# Patient Record
Sex: Female | Born: 1980 | Race: White | Hispanic: No | Marital: Married | Smoking: Never smoker
Health system: Southern US, Community
[De-identification: ages and names within clinical notes are randomized; demographics above are authoritative.]

## PROBLEM LIST (undated history)

## (undated) DIAGNOSIS — F419 Anxiety disorder, unspecified: Secondary | ICD-10-CM

## (undated) DIAGNOSIS — S060XAA Concussion with loss of consciousness status unknown, initial encounter: Secondary | ICD-10-CM

## (undated) DIAGNOSIS — F329 Major depressive disorder, single episode, unspecified: Secondary | ICD-10-CM

## (undated) DIAGNOSIS — S060X9A Concussion with loss of consciousness of unspecified duration, initial encounter: Secondary | ICD-10-CM

## (undated) DIAGNOSIS — G5600 Carpal tunnel syndrome, unspecified upper limb: Secondary | ICD-10-CM

## (undated) DIAGNOSIS — F32A Depression, unspecified: Secondary | ICD-10-CM

---

## 2015-02-11 ENCOUNTER — Emergency Department
Admission: EM | Admit: 2015-02-11 | Discharge: 2015-02-11 | Disposition: A | Discharge status: Home / Self Care | Payer: Self-pay | Attending: Emergency Medicine | Admitting: Emergency Medicine

## 2015-02-11 ENCOUNTER — Encounter: Payer: Self-pay | Admitting: Emergency Medicine

## 2015-02-11 DIAGNOSIS — N390 Urinary tract infection, site not specified: Secondary | ICD-10-CM | POA: Insufficient documentation

## 2015-02-11 DIAGNOSIS — Z3202 Encounter for pregnancy test, result negative: Secondary | ICD-10-CM | POA: Insufficient documentation

## 2015-02-11 LAB — URINALYSIS COMPLETE WITH MICROSCOPIC (ARMC ONLY)
Bilirubin Urine: NEGATIVE
GLUCOSE, UA: NEGATIVE mg/dL
Ketones, ur: NEGATIVE mg/dL
NITRITE: NEGATIVE
PH: 5 (ref 5.0–8.0)
PROTEIN: 100 mg/dL — AB
SPECIFIC GRAVITY, URINE: 1.016 (ref 1.005–1.030)

## 2015-02-11 LAB — PREGNANCY, URINE: Preg Test, Ur: NEGATIVE

## 2015-02-11 MED ORDER — CEFTRIAXONE SODIUM 1 G IJ SOLR
INTRAMUSCULAR | Status: AC
  Filled 2015-02-11: qty 10

## 2015-02-11 MED ORDER — LIDOCAINE HCL (PF) 1 % IJ SOLN
5.0000 mL | Freq: Once | INTRAMUSCULAR | Status: AC
  Administered 2015-02-11: 5 mL

## 2015-02-11 MED ORDER — CEPHALEXIN 500 MG PO CAPS: 500.0000 mg | ORAL_CAPSULE | Freq: Four times a day (QID) | ORAL | Status: AC

## 2015-02-11 MED ORDER — PHENAZOPYRIDINE HCL 200 MG PO TABS
200.0000 mg | ORAL_TABLET | Freq: Three times a day (TID) | ORAL | Status: AC | PRN
Start: 2015-02-11 — End: 2016-02-11

## 2015-02-11 MED ORDER — LIDOCAINE HCL (PF) 1 % IJ SOLN
INTRAMUSCULAR | Status: AC
  Administered 2015-02-11: 5 mL
  Filled 2015-02-11: qty 5

## 2015-02-11 MED ORDER — CEFTRIAXONE SODIUM 250 MG IJ SOLR: 250.0000 mg | Freq: Once | INTRAMUSCULAR | Status: DC

## 2015-02-11 MED ORDER — LIDOCAINE HCL (PF) 1 % IJ SOLN
INTRAMUSCULAR | Status: AC
  Filled 2015-02-11: qty 5

## 2015-02-11 MED ORDER — CEFTRIAXONE SODIUM 1 G IJ SOLR
1.0000 g | Freq: Once | INTRAMUSCULAR | Status: AC
  Administered 2015-02-11: 1 g via INTRAMUSCULAR

## 2015-02-11 MED ORDER — CEFTRIAXONE SODIUM 1 G IJ SOLR
INTRAMUSCULAR | Status: AC
  Administered 2015-02-11: 1 g via INTRAMUSCULAR
  Filled 2015-02-11: qty 10

## 2015-02-11 NOTE — ED Notes (Signed)
Pt reports that she thinks that she may have a UTI, She is having pain when urinating.

## 2015-02-11 NOTE — ED Provider Notes (Signed)
CSN: [id number]     Arrival date & time 02/11/15  0715 History   First MD Initiated Contact with Patient 02/11/15 [phone]     Chief Complaint  Patient presents with  . Urinary Frequency     (Consider location/radiation/quality/duration/timing/severity/associated sxs/prior Treatment) HPI Comments: 34 year old female presents today complaining of 2 days of dysuria and urinary frequency. Feels like she has to urinate all of the time. Is having sharp pains in her suprapubic area with urination. Some nausea yesterday without vomiting. No back pain or fevers. History of UTI with similar symptoms.   Patient is a 34 y.o. female presenting with frequency. The history is provided by the patient.  Urinary Frequency This is a new problem. The current episode started yesterday. The problem occurs constantly. The problem has been gradually worsening. Associated symptoms include abdominal pain, nausea and urinary symptoms. Pertinent negatives include no chills, fever or vomiting. She has tried nothing for the symptoms.    History reviewed. No pertinent past medical history. History reviewed. No pertinent past surgical history. History reviewed. No pertinent family history. History  Substance Use Topics  . Smoking status: Never Smoker   . Smokeless tobacco: Not on file  . Alcohol Use: No   OB History    No data available     Review of Systems  Constitutional: Negative for fever and chills.  Gastrointestinal: Positive for nausea and abdominal pain. Negative for vomiting.  Genitourinary: Positive for dysuria, frequency and difficulty urinating. Negative for flank pain.  All other systems reviewed and are negative.     Allergies  Review of patient's allergies indicates no known allergies.  Home Medications   Prior to Admission medications   Medication Sig Start Date End Date Taking? Authorizing Provider  cephALEXin (KEFLEX) 500 MG capsule Take 1 capsule (500 mg total) by mouth 4 (four) times  daily. 02/11/15 02/21/15  Luvenia Redden, PA-C  phenazopyridine (PYRIDIUM) 200 MG tablet Take 1 tablet (200 mg total) by mouth 3 (three) times daily as needed for pain. 02/11/15 02/11/16  Wilber Oliphant V, PA-C   BP 107/81 mmHg  Pulse 81  Temp(Src) 97.6 F (36.4 C) (Oral)  Resp 18  Ht  (1.6 m)  Wt 130 lb (58.968 kg)  BMI 23.03 kg/m2  SpO2 98% Physical Exam  Constitutional: She is oriented to person, place, and time. She appears well-developed and well-nourished.  HENT:  Head: Normocephalic and atraumatic.  Cardiovascular: Normal rate, regular rhythm and normal heart sounds.  Exam reveals no gallop and no friction rub.   No murmur heard. Pulmonary/Chest: Effort normal and breath sounds normal. No respiratory distress.  Abdominal: Soft. Bowel sounds are normal. She exhibits no distension. There is tenderness in the suprapubic area. There is no rigidity, no rebound, no guarding and no CVA tenderness.  Musculoskeletal: Normal range of motion.  Neurological: She is alert and oriented to person, place, and time.  Skin: Skin is warm and dry.  Psychiatric: She has a normal mood and affect. Her behavior is normal.  Nursing note and vitals reviewed.   ED Course  Procedures (including critical care time) Labs Review Labs Reviewed  URINALYSIS COMPLETEWITH MICROSCOPIC [facility])  - Abnormal; Notable for the following:    Color, Urine YELLOW (*)    APPearance CLOUDY (*)    Hgb urine dipstick 3+ (*)    Protein, ur 100 (*)    Leukocytes, UA 2+ (*)    Bacteria, UA RARE (*)    Squamous Epithelial / LPF  6-30 (*)    All other components within normal limits  PREGNANCY, URINE   Results for orders placed or performed during the hospital encounter of 02/11/15  Urinalysis complete, with microscopic [facility])  Result Value Ref Range   Color, Urine YELLOW (A) YELLOW   APPearance CLOUDY (A) CLEAR   Glucose, UA NEGATIVE NEGATIVE mg/dL   Bilirubin Urine NEGATIVE NEGATIVE   Ketones, ur NEGATIVE NEGATIVE  mg/dL   Specific Gravity, Urine 1.016 1.005 - 1.030   Hgb urine dipstick 3+ (A) NEGATIVE   pH 5.0 5.0 - 8.0   Protein, ur 100 (A) NEGATIVE mg/dL   Nitrite NEGATIVE NEGATIVE   Leukocytes, UA 2+ (A) NEGATIVE   RBC / HPF TOO NUMEROUS TO COUNT 0 - 5 RBC/hpf   WBC, UA TOO NUMEROUS TO COUNT 0 - 5 WBC/hpf   Bacteria, UA RARE (A) NONE SEEN   Squamous Epithelial / LPF 6-30 (A) NONE SEEN   WBC Clumps PRESENT    Mucous PRESENT   Pregnancy, urine  Result Value Ref Range   Preg Test, Ur NEGATIVE NEGATIVE     Imaging Review No results found.   EKG Interpretation None      MDM  Rocephin 1 g IM administered in the emergency room. Patient tolerated well. Prescription for Keflex 500 mg 4 times a day for 10 days. Patient is well-appearing has no abdominal or lower back pain. Tolerating by mouth, vital signs are stable. Strict return precautions given. For increased pain, abdominal or back pain, fevers, or nausea with vomiting return to ER. Final diagnoses:  UTI (lower urinary tract infection)        Wilber OliphantEmma Weavil V, PA-C 02/11/15 1031  Governor Rooksebecca Lord, MD 02/13/15 1515

## 2015-02-11 NOTE — ED Notes (Signed)
Pt given specimen cup but states "I feel like I can go but I can't" pt informed that we need urine specimen asap

## 2015-04-07 ENCOUNTER — Emergency Department
Admission: EM | Admit: 2015-04-07 | Discharge: 2015-04-07 | Disposition: A | Discharge status: Home / Self Care | Payer: Self-pay | Attending: Emergency Medicine | Admitting: Emergency Medicine

## 2015-04-07 ENCOUNTER — Emergency Department: Payer: Self-pay

## 2015-04-07 DIAGNOSIS — Y9389 Activity, other specified: Secondary | ICD-10-CM | POA: Insufficient documentation

## 2015-04-07 DIAGNOSIS — Y998 Other external cause status: Secondary | ICD-10-CM | POA: Insufficient documentation

## 2015-04-07 DIAGNOSIS — Y9289 Other specified places as the place of occurrence of the external cause: Secondary | ICD-10-CM | POA: Insufficient documentation

## 2015-04-07 DIAGNOSIS — S00212A Abrasion of left eyelid and periocular area, initial encounter: Secondary | ICD-10-CM | POA: Insufficient documentation

## 2015-04-07 DIAGNOSIS — S0990XA Unspecified injury of head, initial encounter: Secondary | ICD-10-CM

## 2015-04-07 DIAGNOSIS — W228XXA Striking against or struck by other objects, initial encounter: Secondary | ICD-10-CM | POA: Insufficient documentation

## 2015-04-07 DIAGNOSIS — T148XXA Other injury of unspecified body region, initial encounter: Secondary | ICD-10-CM

## 2015-04-07 HISTORY — DX: Concussion with loss of consciousness status unknown, initial encounter: S06.0XAA

## 2015-04-07 HISTORY — DX: Concussion with loss of consciousness of unspecified duration, initial encounter: S06.0X9A

## 2015-04-07 MED ORDER — HYDROCODONE-ACETAMINOPHEN 5-325 MG PO TABS
1.0000 | ORAL_TABLET | Freq: Once | ORAL | Status: AC
  Administered 2015-04-07: 1 via ORAL
  Filled 2015-04-07: qty 1

## 2015-04-07 MED ORDER — IBUPROFEN 600 MG PO TABS
600.0000 mg | ORAL_TABLET | Freq: Three times a day (TID) | ORAL | Status: DC | PRN
Start: 2015-04-07 — End: 2015-10-06

## 2015-04-07 MED ORDER — ONDANSETRON HCL 4 MG PO TABS: 4.0000 mg | ORAL_TABLET | Freq: Three times a day (TID) | ORAL | Status: AC | PRN

## 2015-04-07 MED ORDER — ONDANSETRON 4 MG PO TBDP
4.0000 mg | ORAL_TABLET | Freq: Once | ORAL | Status: AC
  Administered 2015-04-07: 4 mg via ORAL
  Filled 2015-04-07: qty 1

## 2015-04-07 MED ORDER — IBUPROFEN 600 MG PO TABS
600.0000 mg | ORAL_TABLET | Freq: Once | ORAL | Status: AC
  Administered 2015-04-07: 600 mg via ORAL
  Filled 2015-04-07: qty 1
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
## 2015-04-07 NOTE — ED Notes (Signed)
Pt has a small abrasion with notable swelling about the left eye.
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
## 2015-04-07 NOTE — Discharge Instructions (Signed)
1. Take medicines as needed for pain and nausea (Motrin/Zofran #15). 2. Apply ice to affected area several times daily. 3. Return to the ER for worsening symptoms, persistent vomiting, lethargy or other concerns.  Head Injury You have received a head injury. It does not appear serious at this time. Headaches and vomiting are common following head injury. It should be easy to awaken from sleeping. Sometimes it is necessary for you to stay in the emergency department for a while for observation. Sometimes admission to the hospital may be needed. After injuries such as yours, most problems occur within the first 24 hours, but side effects may occur up to 7-10 days after the injury. It is important for you to carefully monitor your condition and contact your health care provider or seek immediate medical care if there is a change in your condition. WHAT ARE THE TYPES OF HEAD INJURIES? Head injuries can be as minor as a bump. Some head injuries can be more severe. More severe head injuries include:  A jarring injury to the brain (concussion).  A bruise of the brain (contusion). This mean there is bleeding in the brain that can cause swelling.  A cracked skull (skull fracture).  Bleeding in the brain that collects, clots, and forms a bump (hematoma). WHAT CAUSES A HEAD INJURY? A serious head injury is most likely to happen to someone who is in a car wreck and is not wearing a seat belt. Other causes of major head injuries include bicycle or motorcycle accidents, sports injuries, and falls. HOW ARE HEAD INJURIES DIAGNOSED? A complete history of the event leading to the injury and your current symptoms will be helpful in diagnosing head injuries. Many times, pictures of the brain, such as CT or MRI are needed to see the extent of the injury. Often, an overnight hospital stay is necessary for observation.  WHEN SHOULD I SEEK IMMEDIATE MEDICAL CARE?  You should get help right away if:  You have  confusion or drowsiness.  You feel sick to your stomach (nauseous) or have continued, forceful vomiting.  You have dizziness or unsteadiness that is getting worse.  You have severe, continued headaches not relieved by medicine. Only take over-the-counter or prescription medicines for pain, fever, or discomfort as directed by your health care provider.  You do not have normal function of the arms or legs or are unable to walk.  You notice changes in the black spots in the center of the colored part of your eye (pupil).  You have a clear or bloody fluid coming from your nose or ears.  You have a loss of vision. During the next 24 hours after the injury, you must stay with someone who can watch you for the warning signs. This person should contact local emergency services (911 in the [place] if you have seizures, you become unconscious, or you are unable to wake up. HOW CAN I PREVENT A HEAD INJURY IN THE FUTURE? The most important factor for preventing major head injuries is avoiding motor vehicle accidents. To minimize the potential for damage to your head, it is crucial to wear seat belts while riding in motor vehicles. Wearing helmets while bike riding and playing collision sports (like football) is also helpful. Also, avoiding dangerous activities around the house will further help reduce your risk of head injury.  WHEN CAN I RETURN TO NORMAL ACTIVITIES AND ATHLETICS? You should be reevaluated by your health care provider before returning to these activities. If you have any of  the following symptoms, you should not return to activities or contact sports until 1 week after the symptoms have stopped:  Persistent headache.  Dizziness or vertigo.  Poor attention and concentration.  Confusion.  Memory problems.  Nausea or vomiting.  Fatigue or tire easily.  Irritability.  Intolerant of bright lights or loud noises.  Anxiety or depression.  Disturbed sleep. MAKE SURE YOU:    Understand these instructions.  Will watch your condition.  Will get help right away if you are not doing well or get worse. Document Released: 09/09/2005 Document Revised: 09/14/2013 Document Reviewed: 05/17/2013 [facility] Patient Information 2015 [place], [place]. This information is not intended to replace advice given to you by your health care provider. Make sure you discuss any questions you have with your health care provider.  Abrasion An abrasion is a cut or scrape of the skin. Abrasions do not extend through all layers of the skin and most heal within 10 days. It is important to care for your abrasion properly to prevent infection. CAUSES  Most abrasions are caused by falling on, or gliding across, the ground or other surface. When your skin rubs on something, the outer and inner layer of skin rubs off, causing an abrasion. DIAGNOSIS  Your caregiver will be able to diagnose an abrasion during a physical exam.  TREATMENT  Your treatment depends on how large and deep the abrasion is. Generally, your abrasion will be cleaned with water and a mild soap to remove any dirt or debris. An antibiotic ointment may be put over the abrasion to prevent an infection. A bandage (dressing) may be wrapped around the abrasion to keep it from getting dirty.  You may need a tetanus shot if:  You cannot remember when you had your last tetanus shot.  You have never had a tetanus shot.  The injury broke your skin. If you get a tetanus shot, your arm may swell, get red, and feel warm to the touch. This is common and not a problem. If you need a tetanus shot and you choose not to have one, there is a rare chance of getting tetanus. Sickness from tetanus can be serious.  HOME CARE INSTRUCTIONS   If a dressing was applied, change it at least once a day or as directed by your caregiver. If the bandage sticks, soak it off with warm water.   Wash the area with water and a mild soap to remove all the  ointment 2 times a day. Rinse off the soap and pat the area dry with a clean towel.   Reapply any ointment as directed by your caregiver. This will help prevent infection and keep the bandage from sticking. Use gauze over the wound and under the dressing to help keep the bandage from sticking.   Change your dressing right away if it becomes wet or dirty.   Only take over-the-counter or prescription medicines for pain, discomfort, or fever as directed by your caregiver.   Follow up with your caregiver within 24-48 hours for a wound check, or as directed. If you were not given a wound-check appointment, look closely at your abrasion for redness, swelling, or pus. These are signs of infection. SEEK IMMEDIATE MEDICAL CARE IF:   You have increasing pain in the wound.   You have redness, swelling, or tenderness around the wound.   You have pus coming from the wound.   You have a fever or persistent symptoms for more than 2-3 days.  You have a fever  and your symptoms suddenly get worse.  You have a bad smell coming from the wound or dressing.  MAKE SURE YOU:   Understand these instructions.  Will watch your condition.  Will get help right away if you are not doing well or get worse. Document Released: 06/19/2005 Document Revised: 08/26/2012 Document Reviewed: 08/13/2011 [facility] Patient Information 2015 [place], [place]. This information is not intended to replace advice given to you by your health care provider. Make sure you discuss any questions you have with your health care provider.
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
## 2015-04-27 ENCOUNTER — Encounter: Payer: Self-pay | Admitting: Emergency Medicine

## 2015-04-27 ENCOUNTER — Emergency Department
Admission: EM | Admit: 2015-04-27 | Discharge: 2015-04-27 | Disposition: A | Discharge status: Home / Self Care | Payer: Self-pay | Attending: Emergency Medicine | Admitting: Emergency Medicine

## 2015-04-27 DIAGNOSIS — X58XXXA Exposure to other specified factors, initial encounter: Secondary | ICD-10-CM | POA: Insufficient documentation

## 2015-04-27 DIAGNOSIS — Z3202 Encounter for pregnancy test, result negative: Secondary | ICD-10-CM | POA: Insufficient documentation

## 2015-04-27 DIAGNOSIS — Z79899 Other long term (current) drug therapy: Secondary | ICD-10-CM | POA: Insufficient documentation

## 2015-04-27 DIAGNOSIS — S39011A Strain of muscle, fascia and tendon of abdomen, initial encounter: Secondary | ICD-10-CM | POA: Insufficient documentation

## 2015-04-27 DIAGNOSIS — N390 Urinary tract infection, site not specified: Secondary | ICD-10-CM | POA: Insufficient documentation

## 2015-04-27 DIAGNOSIS — M549 Dorsalgia, unspecified: Secondary | ICD-10-CM | POA: Insufficient documentation

## 2015-04-27 DIAGNOSIS — Y9352 Activity, horseback riding: Secondary | ICD-10-CM | POA: Insufficient documentation

## 2015-04-27 DIAGNOSIS — Y998 Other external cause status: Secondary | ICD-10-CM | POA: Insufficient documentation

## 2015-04-27 DIAGNOSIS — Y9289 Other specified places as the place of occurrence of the external cause: Secondary | ICD-10-CM | POA: Insufficient documentation

## 2015-04-27 LAB — CBC WITH DIFFERENTIAL/PLATELET
Basophils Absolute: 0 10*3/uL (ref 0–0.1)
Basophils Relative: 0 %
EOS ABS: 0 10*3/uL (ref 0–0.7)
Eosinophils Relative: 1 %
HCT: 40.2 % (ref 35.0–47.0)
Hemoglobin: 13.9 g/dL (ref 12.0–16.0)
LYMPHS PCT: 20 %
Lymphs Abs: 1.4 10*3/uL (ref 1.0–3.6)
MCH: 31.1 pg (ref 26.0–34.0)
MCHC: 34.6 g/dL (ref 32.0–36.0)
MCV: 89.7 fL (ref 80.0–100.0)
MONOS PCT: 10 %
Monocytes Absolute: 0.7 10*3/uL (ref 0.2–0.9)
NEUTROS ABS: 4.7 10*3/uL (ref 1.4–6.5)
Neutrophils Relative %: 69 %
Platelets: 229 10*3/uL (ref 150–440)
RBC: 4.48 MIL/uL (ref 3.80–5.20)
RDW: 13 % (ref 11.5–14.5)
WBC: 6.8 10*3/uL (ref 3.6–11.0)

## 2015-04-27 LAB — COMPREHENSIVE METABOLIC PANEL
ALT: 17 U/L (ref 14–54)
ANION GAP: 8 (ref 5–15)
AST: 24 U/L (ref 15–41)
Albumin: 4.8 g/dL (ref 3.5–5.0)
Alkaline Phosphatase: 41 U/L (ref 38–126)
BILIRUBIN TOTAL: 1.3 mg/dL — AB (ref 0.3–1.2)
BUN: 9 mg/dL (ref 6–20)
CALCIUM: 9.2 mg/dL (ref 8.9–10.3)
CO2: 28 mmol/L (ref 22–32)
Chloride: 102 mmol/L (ref 101–111)
Creatinine, Ser: 0.73 mg/dL (ref 0.44–1.00)
GFR calc non Af Amer: >60 mL/min (ref >60)
GLUCOSE: 101 mg/dL — AB (ref 65–99)
Potassium: 3.5 mmol/L (ref 3.5–5.1)
Sodium: 138 mmol/L (ref 135–145)
Total Protein: 7.5 g/dL (ref 6.5–8.1)

## 2015-04-27 LAB — URINALYSIS COMPLETE WITH MICROSCOPIC (ARMC ONLY)
BILIRUBIN URINE: NEGATIVE
Glucose, UA: NEGATIVE mg/dL
Hgb urine dipstick: NEGATIVE
Ketones, ur: NEGATIVE mg/dL
Nitrite: NEGATIVE
PROTEIN: NEGATIVE mg/dL
Specific Gravity, Urine: 1.008 (ref 1.005–1.030)
pH: 5 (ref 5.0–8.0)

## 2015-04-27 LAB — POCT PREGNANCY, URINE: Preg Test, Ur: NEGATIVE

## 2015-04-27 MED ORDER — OXYCODONE-ACETAMINOPHEN 5-325 MG PO TABS: 1.0000 | ORAL_TABLET | ORAL | Status: DC | PRN

## 2015-04-27 MED ORDER — DIAZEPAM 2 MG PO TABS: 2.0000 mg | ORAL_TABLET | Freq: Three times a day (TID) | ORAL | Status: DC | PRN

## 2015-04-27 MED ORDER — CEPHALEXIN 500 MG PO CAPS: 500.0000 mg | ORAL_CAPSULE | Freq: Four times a day (QID) | ORAL | Status: DC

## 2015-04-27 MED ORDER — ONDANSETRON HCL 4 MG/2ML IJ SOLN
4.0000 mg | Freq: Once | INTRAMUSCULAR | Status: AC
  Administered 2015-04-27: 4 mg via INTRAVENOUS
  Filled 2015-04-27: qty 2

## 2015-04-27 MED ORDER — KETOROLAC TROMETHAMINE 30 MG/ML IJ SOLN
30.0000 mg | Freq: Once | INTRAMUSCULAR | Status: AC
  Administered 2015-04-27: 30 mg via INTRAVENOUS
  Filled 2015-04-27: qty 1

## 2015-04-27 NOTE — Discharge Instructions (Signed)
° °  INCREASE FLUIDS, TAKE PERCOCET FOR PAIN KEFLEX FOR INFECTION FOR 10 DAYS MOIST HEAT OR ICE TO BACK AND ABDOMINAL MUSCLES FOR STRAIN NO LIFTING, PUSHING OR PULLING FOR 1 WEEK FOLLOW UP WITH [facility] IF ANY CONTINUED PROBLEMS OR RETURN TO ER IF ANY SEVERE WORSENING OF YOUR SYMPTOMS
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
## 2015-04-27 NOTE — ED Provider Notes (Signed)
[facility] Emergency Department Provider Note  ____________________________________________  Time seen: 7:23 AM  I have reviewed the triage vital signs and the nursing notes.   HISTORY  Chief Complaint Abdominal Injury   HPI Shannon Phelps is a 34 y.o. female is here today with right lower quadrant pain. She states she was horsing around with her husband last p.m. and hyperextended her back. She states at that time she felt a "pop" in her right lower quadrant followed by some burning. She states the pain radiates down into her pelvic area. She has had some nausea. She denies any fever or chills. She denies any history of urinary tract infections or vaginal discharge. She denies any previous back injuries or pain. There is no paresthesias down into her extremities. She denies any bowel or bladder incontinence. Currently she rates her pain a 10 out of 10. Pain increases with movement and she states she cannot get a comfortable position. Patient also drove to the emergency room and is alone. She has not taken any over-the-counter medication this morning for pain.   Past Medical History  Diagnosis Date  . Concussion     There are no active problems to display for this patient.   History reviewed. No pertinent past surgical history.  Current Outpatient Rx  Name  Route  Sig  Dispense  Refill  . cephALEXin (KEFLEX) 500 MG capsule   Oral   Take 1 capsule (500 mg total) by mouth 4 (four) times daily.   40 capsule   0   . diazepam (VALIUM) 2 MG tablet   Oral   Take 1 tablet (2 mg total) by mouth every 8 (eight) hours as needed for muscle spasms.   9 tablet   0   . ibuprofen (ADVIL,MOTRIN) 600 MG tablet   Oral   Take 1 tablet (600 mg total) by mouth every 8 (eight) hours as needed.   15 tablet   0   . ondansetron (ZOFRAN) 4 MG tablet   Oral   Take 1 tablet (4 mg total) by mouth every 8 (eight) hours as needed for nausea or vomiting.   15 tablet   0    . oxyCODONE-acetaminophen (PERCOCET) 5-325 MG per tablet   Oral   Take 1 tablet by mouth every 4 (four) hours as needed for severe pain.   20 tablet   0   . phenazopyridine (PYRIDIUM) 200 MG tablet   Oral   Take 1 tablet (200 mg total) by mouth 3 (three) times daily as needed for pain.   20 tablet   0     Allergies Review of patient's allergies indicates no known allergies.  No family history on file.  Social History History  Substance Use Topics  . Smoking status: Never Smoker   . Smokeless tobacco: Not on file  . Alcohol Use: No    Review of Systems Constitutional: No fever/chills Eyes: No visual changes. ENT: No sore throat. Cardiovascular: Denies chest pain. Respiratory: Denies shortness of breath. Gastrointestinal: Positive right lower quadrant abdominal pain.  No nausea, no vomiting.  No diarrhea.  No constipation. Genitourinary: Negative for dysuria. Musculoskeletal: Positive for back pain. Skin: Negative for rash. Neurological: Negative for headaches, focal weakness or numbness.  10-point ROS otherwise negative.  ____________________________________________   PHYSICAL EXAM:  VITAL SIGNS: ED Triage Vitals  Enc Vitals Group     BP 04/27/15 0718 111/87 mmHg     Pulse Rate 04/27/15 0718 79     Resp  04/27/15 0718 20     Temp 04/27/15 0718 97.8 F (36.6 C)     Temp Source 04/27/15 0718 Oral     SpO2 04/27/15 0718 99 %     Weight 04/27/15 0711 130 lb (58.968 kg)     Height 04/27/15 0711  (1.6 m)     Head Cir --      Peak Flow --      Pain Score 04/27/15 0711 10     Pain Loc --      Pain Edu? --      Excl. in GC? --     Constitutional: Alert and oriented. Well appearing and in no acute distress but extremely uncomfortable Eyes: Conjunctivae are normal. PERRL. EOMI. Head: Atraumatic. Nose: No congestion/rhinnorhea. Neck: No stridor.   Cardiovascular: Normal rate, regular rhythm. Grossly normal heart sounds.  Good peripheral  circulation. Respiratory: Normal respiratory effort.  No retractions. Lungs CTAB. Gastrointestinal: Soft and with moderate tenderness diffusely midline abdomen and over to the right. Range of motion increases his pain.. No distention. Bowel sounds normal 4 quadrants No abdominal bruits. No CVA tenderness. Musculoskeletal: Back exam no gross deformity. Range of motion is restricted secondary to pain. No lower extremity tenderness nor edema.  No joint effusions. Neurologic:  Normal speech and language. No gross focal neurologic deficits are appreciated. No gait instability. Skin:  Skin is warm, dry and intact. No rash noted. Psychiatric: Mood and affect are normal. Speech and behavior are normal.  ____________________________________________   LABS (all labs ordered are listed, but only abnormal results are displayed)  Labs Reviewed  URINALYSIS COMPLETEWITH MICROSCOPIC ([facility] ONLY) - Abnormal; Notable for the following:    Color, Urine YELLOW (*)    APPearance CLOUDY (*)    Leukocytes, UA 3+ (*)    Bacteria, UA RARE (*)    Squamous Epithelial / LPF 6-30 (*)    All other components within normal limits  COMPREHENSIVE METABOLIC PANEL - Abnormal; Notable for the following:    Glucose, Bld 101 (*)    Total Bilirubin 1.3 (*)    All other components within normal limits  CBC WITH DIFFERENTIAL/PLATELET  POC URINE PREG, ED  POCT PREGNANCY, URINE   PROCEDURES  Procedure(s) performed: None  Critical Care performed: No  ____________________________________________   INITIAL IMPRESSION / ASSESSMENT AND PLAN / ED COURSE  Pertinent labs & imaging results that were available during my care of the patient were reviewed by me and considered in my medical decision making (see chart for details).  Patient drove to the emergency room and is here alone therefore no narcotics were given to her while she was in the emergency room. Patient was made aware that she has a urinary tract infection the  most likely also has a muscle strain secondary to her injury. Patient was placed on Keflex 500 mg  4 times a day, Valium 2 mg 3 times a day for 3 days and Percocet as needed for severe pain. Patient is return to the emergency room if any severe worsening of her symptoms. ____________________________________________   FINAL CLINICAL IMPRESSION(S) / ED DIAGNOSES  Final diagnoses:  Acute urinary tract infection  Strain of abdominal muscle, initial encounter      Tommi Rumps, PA-C 04/27/15 1544  Darien Ramus, MD 04/27/15 [phone]

## 2015-10-06 ENCOUNTER — Emergency Department
Admission: EM | Admit: 2015-10-06 | Discharge: 2015-10-06 | Disposition: A | Discharge status: Home / Self Care | Payer: BLUE CROSS/BLUE SHIELD | Attending: Emergency Medicine | Admitting: Emergency Medicine

## 2015-10-06 DIAGNOSIS — Z792 Long term (current) use of antibiotics: Secondary | ICD-10-CM | POA: Diagnosis not present

## 2015-10-06 DIAGNOSIS — T23162A Burn of first degree of back of left hand, initial encounter: Secondary | ICD-10-CM | POA: Insufficient documentation

## 2015-10-06 DIAGNOSIS — T300 Burn of unspecified body region, unspecified degree: Secondary | ICD-10-CM

## 2015-10-06 DIAGNOSIS — Y998 Other external cause status: Secondary | ICD-10-CM | POA: Insufficient documentation

## 2015-10-06 DIAGNOSIS — Y93G3 Activity, cooking and baking: Secondary | ICD-10-CM | POA: Insufficient documentation

## 2015-10-06 DIAGNOSIS — Y92009 Unspecified place in unspecified non-institutional (private) residence as the place of occurrence of the external cause: Secondary | ICD-10-CM | POA: Insufficient documentation

## 2015-10-06 DIAGNOSIS — Z23 Encounter for immunization: Secondary | ICD-10-CM | POA: Diagnosis not present

## 2015-10-06 DIAGNOSIS — X131XXA Other contact with steam and other hot vapors, initial encounter: Secondary | ICD-10-CM | POA: Insufficient documentation

## 2015-10-06 DIAGNOSIS — T23062A Burn of unspecified degree of back of left hand, initial encounter: Secondary | ICD-10-CM | POA: Diagnosis present

## 2015-10-06 MED ORDER — SILVER SULFADIAZINE 1 % EX CREA
TOPICAL_CREAM | Freq: Once | CUTANEOUS | Status: AC
  Administered 2015-10-06: 02:00:00 via TOPICAL
  Filled 2015-10-06: qty 85

## 2015-10-06 MED ORDER — SILVER SULFADIAZINE 1 % EX CREA: TOPICAL_CREAM | CUTANEOUS | Status: DC

## 2015-10-06 MED ORDER — MORPHINE SULFATE (PF) 2 MG/ML IV SOLN
2.0000 mg | Freq: Once | INTRAVENOUS | Status: AC
  Administered 2015-10-06: 2 mg via INTRAVENOUS
  Filled 2015-10-06: qty 1

## 2015-10-06 MED ORDER — SODIUM CHLORIDE 0.9 % IV BOLUS (SEPSIS)
1000.0000 mL | Freq: Once | INTRAVENOUS | Status: AC
  Administered 2015-10-06: 1000 mL via INTRAVENOUS
  Filled 2015-10-06: qty 1000

## 2015-10-06 MED ORDER — OXYCODONE-ACETAMINOPHEN 5-325 MG PO TABS: 1.0000 | ORAL_TABLET | ORAL | Status: DC | PRN

## 2015-10-06 MED ORDER — ONDANSETRON HCL 4 MG/2ML IJ SOLN
4.0000 mg | Freq: Once | INTRAMUSCULAR | Status: AC
  Administered 2015-10-06: 4 mg via INTRAVENOUS
  Filled 2015-10-06: qty 2

## 2015-10-06 MED ORDER — TETANUS-DIPHTH-ACELL PERTUSSIS 5-2.5-18.5 LF-MCG/0.5 IM SUSP
0.5000 mL | Freq: Once | INTRAMUSCULAR | Status: AC
  Administered 2015-10-06: 0.5 mL via INTRAMUSCULAR
  Filled 2015-10-06: qty 0.5

## 2015-10-06 MED ORDER — KETOROLAC TROMETHAMINE 30 MG/ML IJ SOLN
INTRAMUSCULAR | Status: AC
  Filled 2015-10-06: qty 1

## 2015-10-06 MED ORDER — OXYCODONE-ACETAMINOPHEN 5-325 MG PO TABS
1.0000 | ORAL_TABLET | Freq: Once | ORAL | Status: AC
  Administered 2015-10-06: 1 via ORAL
  Filled 2015-10-06: qty 1

## 2015-10-06 MED ORDER — KETOROLAC TROMETHAMINE 30 MG/ML IJ SOLN
30.0000 mg | Freq: Once | INTRAMUSCULAR | Status: AC
  Administered 2015-10-06: 30 mg via INTRAVENOUS
  Filled 2015-10-06: qty 1

## 2015-10-06 MED ORDER — IBUPROFEN 600 MG PO TABS: 600.0000 mg | ORAL_TABLET | Freq: Three times a day (TID) | ORAL | Status: DC | PRN

## 2015-10-06 MED ORDER — SILVER SULFADIAZINE 1 % EX CREA: 1.0000 "application " | TOPICAL_CREAM | Freq: Every day | CUTANEOUS | Status: DC

## 2015-10-06 NOTE — Discharge Instructions (Signed)
1. You may take pain medicines as needed (Motrin/Percocet). 2. Apply Silvadene burn cream twice daily 5 days. 3. Return to the ER for worsening symptoms, fever, redness, swelling, purulent discharge or other concerns.  Burn Care Your skin is a natural barrier to infection. It is the largest organ of your body. Burns damage this natural protection. To help prevent infection, it is very important to follow your caregiver's instructions in the care of your burn. Burns are classified as:  First degree. There is only redness of the skin (erythema). No scarring is expected.  Second degree. There is blistering of the skin. Scarring may occur with deeper burns.  Third degree. All layers of the skin are injured, and scarring is expected. HOME CARE INSTRUCTIONS   Wash your hands well before changing your bandage.  Change your bandage as often as directed by your caregiver.  Remove the old bandage. If the bandage sticks, you may soak it off with cool, clean water.  Cleanse the burn thoroughly but gently with mild soap and water.  Pat the area dry with a clean, dry cloth.  Apply a thin layer of antibacterial cream to the burn.  Apply a clean bandage as instructed by your caregiver.  Keep the bandage as clean and dry as possible.  Elevate the affected area for the first 24 hours, then as instructed by your caregiver.  Only take over-the-counter or prescription medicines for pain, discomfort, or fever as directed by your caregiver. SEEK IMMEDIATE MEDICAL CARE IF:   You develop excessive pain.  You develop redness, tenderness, swelling, or red streaks near the burn.  The burned area develops yellowish-white fluid (pus) or a bad smell.  You have a fever. MAKE SURE YOU:   Understand these instructions.  Will watch your condition.  Will get help right away if you are not doing well or get worse.   This information is not intended to replace advice given to you by your health care  provider. Make sure you discuss any questions you have with your health care provider.   Document Released: 09/09/2005 Document Revised: 12/02/2011 Document Reviewed: 01/30/2011 Elsevier Interactive Patient Education [employer]2016 [employer] Inc.

## 2015-10-06 NOTE — ED Provider Notes (Signed)
[facility] Emergency Department Provider Note  ____________________________________________  Time seen: Approximately 1:18 AM  I have reviewed the triage vital signs and the nursing notes.   HISTORY  Chief Complaint Burn    HPI Shannon Phelps is a 35 y.o. female who presents to the ED from home with a chief complaint of burn. Patient states she was cooking meatballs approximately 8 PM when she lifted the cover and sustained a steam burn to her left hand. Presents with left hand burn and pain which she was unable to get under control at home. Tetanus shot is not up-to-date. Patient denies history of diabetes. She is right-hand dominant. Deniesrecent travel, fever, chills, chest pain, shortness of breath, abdominal pain, nausea, vomiting, diarrhea.   Past Medical History  Diagnosis Date  . Concussion     There are no active problems to display for this patient.   History reviewed. No pertinent past surgical history.  Current Outpatient Rx  Name  Route  Sig  Dispense  Refill  . cephALEXin (KEFLEX) 500 MG capsule   Oral   Take 1 capsule (500 mg total) by mouth 4 (four) times daily.   40 capsule   0   . diazepam (VALIUM) 2 MG tablet   Oral   Take 1 tablet (2 mg total) by mouth every 8 (eight) hours as needed for muscle spasms.   9 tablet   0   . ibuprofen (ADVIL,MOTRIN) 600 MG tablet   Oral   Take 1 tablet (600 mg total) by mouth every 8 (eight) hours as needed.   15 tablet   0   . ondansetron (ZOFRAN) 4 MG tablet   Oral   Take 1 tablet (4 mg total) by mouth every 8 (eight) hours as needed for nausea or vomiting.   15 tablet   0   . oxyCODONE-acetaminophen (PERCOCET) 5-325 MG per tablet   Oral   Take 1 tablet by mouth every 4 (four) hours as needed for severe pain.   20 tablet   0   . phenazopyridine (PYRIDIUM) 200 MG tablet   Oral   Take 1 tablet (200 mg total) by mouth 3 (three) times daily as needed for pain.   20 tablet   0      Allergies Review of patient's allergies indicates no known allergies.  No family history on file.  Social History Social History  Substance Use Topics  . Smoking status: Never Smoker   . Smokeless tobacco: None  . Alcohol Use: No    Review of Systems Constitutional: No fever/chills Eyes: No visual changes. ENT: No sore throat. Cardiovascular: Denies chest pain. Respiratory: Denies shortness of breath. Gastrointestinal: No abdominal pain.  No nausea, no vomiting.  No diarrhea.  No constipation. Genitourinary: Negative for dysuria. Musculoskeletal: Positive for left hand burn and pain. Negative for back pain. Skin: Negative for rash. Neurological: Negative for headaches, focal weakness or numbness.  10-point ROS otherwise negative.  ____________________________________________   PHYSICAL EXAM:  VITAL SIGNS: ED Triage Vitals  Enc Vitals Group     BP 10/06/15 0112 131/83 mmHg     Pulse Rate 10/06/15 0112 81     Resp 10/06/15 0112 18     Temp 10/06/15 0112 98.5 F (36.9 C)     Temp Source 10/06/15 0112 Oral     SpO2 10/06/15 0112 99 %     Weight 10/06/15 0112 140 lb (63.504 kg)     Height 10/06/15 0112 5\' 3"  (1.6 m)  Head Cir --      Peak Flow --      Pain Score 10/06/15 0114 10     Pain Loc --      Pain Edu? --      Excl. in GC? --     Constitutional: Alert and oriented. Well appearing and in mild acute distress. Eyes: Conjunctivae are normal. PERRL. EOMI. Head: Atraumatic. Nose: No congestion/rhinnorhea. Mouth/Throat: Mucous membranes are moist.  Oropharynx non-erythematous. Neck: No stridor.   Cardiovascular: Normal rate, regular rhythm. Grossly normal heart sounds.  Good peripheral circulation. Respiratory: Normal respiratory effort.  No retractions. Lungs CTAB. Gastrointestinal: Soft and nontender. No distention. No abdominal bruits. No CVA tenderness. Musculoskeletal:  Left hand: First degree steam burns to palmar and volar surface of fingers, <  1% TBSA. No blisters, no eschars. 2+ radial pulses. Full range of motion with handgrips. No circumferential burn. Neurologic:  Normal speech and language. No gross focal neurologic deficits are appreciated. No gait instability. Skin:  Skin is warm, dry and intact. No rash noted. Psychiatric: Mood and affect are normal. Speech and behavior are normal.  ____________________________________________   LABS (all labs ordered are listed, but only abnormal results are displayed)  Labs Reviewed - No data to display ____________________________________________  EKG  None ____________________________________________  RADIOLOGY  None ____________________________________________   PROCEDURES  Procedure(s) performed: None  Critical Care performed: No  ____________________________________________   INITIAL IMPRESSION / ASSESSMENT AND PLAN / ED COURSE  Pertinent labs & imaging results that were available during my care of the patient were reviewed by me and considered in my medical decision making (see chart for details).  35 year old female who presents with a steam burn to left, nondominant hand. Will initiate analgesia, tetanus shot, apply Silvadene cream.  ----------------------------------------- 3:38 AM on 10/06/2015 -----------------------------------------  Patient is much improved. She was placed on med hold after receiving IV morphine. She is ambulatory with steady gait. Plan for analgesia, Silvadene burn cream and follow-up with [facility] burn clinic. Strict return precautions given. Patient verbalizes understanding and agrees with plan of care. ____________________________________________   FINAL CLINICAL IMPRESSION(S) / ED DIAGNOSES  Final diagnoses:  Burn      Irean Hong, MD 10/06/15 [phone]

## 2015-10-06 NOTE — ED Notes (Signed)
Patient sustained a steam burn to left hand while cooking at home. Presents with pain and inability to get pain under control.

## 2015-10-06 NOTE — ED Notes (Signed)
Discharge instructions reviewed, all questions answered. RX given

## 2015-10-06 NOTE — ED Notes (Signed)
dressing applied to left hand after silvadene cream.

## 2015-12-28 ENCOUNTER — Ambulatory Visit
Admission: EM | Admit: 2015-12-28 | Discharge: 2015-12-28 | Disposition: A | Discharge status: Home / Self Care | Payer: BLUE CROSS/BLUE SHIELD | Attending: Family Medicine | Admitting: Family Medicine

## 2015-12-28 ENCOUNTER — Encounter: Payer: Self-pay | Admitting: *Deleted

## 2015-12-28 DIAGNOSIS — H1013 Acute atopic conjunctivitis, bilateral: Secondary | ICD-10-CM | POA: Diagnosis not present

## 2015-12-28 DIAGNOSIS — J309 Allergic rhinitis, unspecified: Secondary | ICD-10-CM

## 2015-12-28 MED ORDER — EPINASTINE HCL 0.05 % OP SOLN: 1.0000 [drp] | Freq: Two times a day (BID) | OPHTHALMIC | Status: DC

## 2015-12-28 MED ORDER — CETIRIZINE HCL 10 MG PO CAPS: 10.0000 mg | ORAL_CAPSULE | Freq: Once | ORAL | Status: AC

## 2015-12-28 MED ORDER — CROMOLYN SODIUM 4 % OP SOLN: 2.0000 [drp] | Freq: Four times a day (QID) | OPHTHALMIC | Status: DC

## 2015-12-28 NOTE — ED Provider Notes (Signed)
CSN: [id number]     Arrival date & time 12/28/15  1706 History   First MD Initiated Contact with Patient 12/28/15 1818     Chief Complaint  Patient presents with  . Conjunctivitis   (Consider location/radiation/quality/duration/timing/severity/associated sxs/prior Treatment) HPI  35 year old female who presents with symptoms of bilateral eye discomfort for 2 days. She is complaining of burning in her eyes and some pain behind her eyes. She denies any visual disturbances. Is not any matting in the morning. She has had some increased redness of her eyes. Is not wear contacts. No foreign body sensation but does have some slight photosensitivity. She had 2 coworkers that have similar symptoms and she thinks that she may have contacted through them.     Past Medical History  Diagnosis Date  . Concussion    History reviewed. No pertinent past surgical history. History reviewed. No pertinent family history. Social History  Substance Use Topics  . Smoking status: Never Smoker   . Smokeless tobacco: Never Used  . Alcohol Use: No   OB History    No data available     Review of Systems  Constitutional: Positive for fever. Negative for chills, activity change and fatigue.  Eyes: Positive for photophobia, pain, discharge and redness. Negative for visual disturbance.  All other systems reviewed and are negative.   Allergies  Naproxen and Prevacid  Home Medications   Prior to Admission medications   Medication Sig Start Date End Date Taking? Authorizing Provider  cephALEXin (KEFLEX) 500 MG capsule Take 1 capsule (500 mg total) by mouth 4 (four) times daily. 04/27/15   Tommi Rumpshonda L Summers, PA-C  Cetirizine HCl 10 MG CAPS Take 1 capsule (10 mg total) by mouth once. Use daily during pollen season 12/28/15   Lutricia FeilWilliam P Ej Pinson, PA-C  cromolyn (OPTICROM) 4 % ophthalmic solution Place 2 drops into both eyes 4 (four) times daily. 12/28/15   Lutricia FeilWilliam P Milliani Herrada, PA-C  diazepam (VALIUM) 2 MG tablet Take 1  tablet (2 mg total) by mouth every 8 (eight) hours as needed for muscle spasms. 04/27/15   Tommi Rumpshonda L Summers, PA-C  Epinastine HCl 0.05 % ophthalmic solution Place 1 drop into both eyes 2 (two) times daily. 12/28/15   Lutricia FeilWilliam P Zaeem Kandel, PA-C  ibuprofen (ADVIL,MOTRIN) 600 MG tablet Take 1 tablet (600 mg total) by mouth every 8 (eight) hours as needed. 10/06/15   Irean HongJade J Sung, MD  ondansetron (ZOFRAN) 4 MG tablet Take 1 tablet (4 mg total) by mouth every 8 (eight) hours as needed for nausea or vomiting. 04/07/15   Irean HongJade J Sung, MD  oxyCODONE-acetaminophen (ROXICET) 5-325 MG tablet Take 1 tablet by mouth every 4 (four) hours as needed for severe pain. 10/06/15   Irean HongJade J Sung, MD  phenazopyridine (PYRIDIUM) 200 MG tablet Take 1 tablet (200 mg total) by mouth 3 (three) times daily as needed for pain. 02/11/15 02/11/16  Christella ScheuermannEmma Lawrence V, PA-C  silver sulfADIAZINE (SILVADENE) 1 % cream Apply to affected area twice daily 10/06/15 10/05/16  Irean HongJade J Sung, MD   Meds Ordered and Administered this Visit  Medications - No data to display  BP 108/74 mmHg  Pulse 72  Temp(Src) 98.2 F (36.8 C) (Oral)  Resp 18  Ht 5\' 3"  (1.6 m)  Wt 135 lb (61.236 kg)  BMI 23.92 kg/m2  SpO2 100%  LMP 12/09/2015 No data found.   Physical Exam  Constitutional: She is oriented to person, place, and time. She appears well-developed and well-nourished. No distress.  HENT:  Head: Normocephalic and atraumatic.  Eyes: Pupils are equal, round, and reactive to light. Right eye exhibits no discharge. Left eye exhibits no discharge. No scleral icterus.  Examination of the eyes shows a slight injection of the conjunctiva. Pupils are equal reactive to light and accommodation. EOMs are intact and full. Iris appears normal. No discharge is seen today. Visual acuity is as recorded.  Neck: Normal range of motion. Neck supple.  Musculoskeletal: Normal range of motion.  Lymphadenopathy:    She has no cervical adenopathy.  Neurological: She is alert and  oriented to person, place, and time.  Skin: Skin is warm and dry. She is not diaphoretic.  Psychiatric: She has a normal mood and affect. Her behavior is normal. Judgment and thought content normal.  Nursing note and vitals reviewed.   ED Course  Procedures (including critical care time)  Labs Review Labs Reviewed - No data to display  Imaging Review No results found.   Visual Acuity Review  Right Eye Distance: 20/25 Left Eye Distance: 20/25 Bilateral Distance: 20/25  Right Eye Near:   Left Eye Near:    Bilateral Near:         MDM   1. Allergic conjunctivitis and rhinitis, bilateral    Discharge Medication List as of 12/28/2015  6:45 PM    START taking these medications   Details  Cetirizine HCl 10 MG CAPS Take 1 capsule (10 mg total) by mouth once. Use daily during pollen season, Starting 12/28/2015, Normal    cromolyn (OPTICROM) 4 % ophthalmic solution Place 2 drops into both eyes 4 (four) times daily., Starting 12/28/2015, Until Discontinued, Normal    Epinastine HCl 0.05 % ophthalmic solution Place 1 drop into both eyes 2 (two) times daily., Starting 12/28/2015, Until Discontinued, Normal      Plan: 1. Test/x-ray results and diagnosis reviewed with patient 2. rx as per orders; risks, benefits, potential side effects reviewed with patient 3. Recommend supportive treatment with Use of oral antihistamines. I recommended Zyrtec. She needs "compresses for comfort over her eyes. I recommended that she follow up with [facility] since she mentions that she had a port wine stain that need to be followed. 4. F/u prn if symptoms worsen or don't improve     Lutricia Feil, PA-C 12/28/15 1900

## 2015-12-28 NOTE — ED Notes (Signed)
Patient started having symptoms bilateral eye discomfort 2 days ago. Patient has been exposed to pink eye at work.

## 2015-12-28 NOTE — Discharge Instructions (Signed)
Allergic Conjunctivitis Allergic conjunctivitis is inflammation of the clear membrane that covers the white part of your eye and the inner surface of your eyelid (conjunctiva), and it is caused by allergies. The blood vessels in the conjunctiva become inflamed, and this causes the eye to become red or pink, and it often causes itchiness in the eye. Allergic conjunctivitis cannot be spread by one person to another person (noncontagious). CAUSES This condition is caused by an allergic reaction. Common causes of an allergic reaction (allergens) include: 1. Dust. 2. Pollen. 3. Mold. 4. Animal dander or secretions. RISK FACTORS This condition is more likely to develop if you are exposed to high levels of allergens that cause the allergic reaction. This might include being outdoors when air pollen levels are high or being around animals that you are allergic to. SYMPTOMS Symptoms of this condition may include: 1. Eye redness. 2. Tearing of the eyes. 3. Watery eyes. 4. Itchy eyes. 5. Burning feeling in the eyes. 6. Clear drainage from the eyes. 7. Swollen eyelids. DIAGNOSIS This condition may be diagnosed by medical history and physical exam. If you have drainage from your eyes, it may be tested to rule out other causes of conjunctivitis. TREATMENT Treatment for this condition often includes medicines. These may be eye drops, ointments, or oral medicines. They may be prescription medicines or over-the-counter medicines. HOME CARE INSTRUCTIONS  Take or apply medicines only as directed by your health care provider.  Do not touch or rub your eyes.  Do not wear contact lenses until the inflammation is gone. Wear glasses instead.  Do not wear eye makeup until the inflammation is gone.  Apply a cool, clean washcloth to your eye for 10-20 minutes, 3-4 times a day.  Try to avoid whatever allergen is causing the allergic reaction. SEEK MEDICAL CARE IF:  Your symptoms get worse.  You have pus  draining from your eye.  You have new symptoms.  You have a fever.   This information is not intended to replace advice given to you by your health care provider. Make sure you discuss any questions you have with your health care provider.   Document Released: 11/30/2002 Document Revised: 09/30/2014 Document Reviewed: 06/21/2014 Elsevier Interactive Patient Education 2016 [employer].  Allergic Conjunctivitis Allergic conjunctivitis is inflammation of the clear membrane that covers the white part of your eye and the inner surface of your eyelid (conjunctiva), and it is caused by allergies. The blood vessels in the conjunctiva become inflamed, and this causes the eye to become red or pink, and it often causes itchiness in the eye. Allergic conjunctivitis cannot be spread by one person to another person (noncontagious). CAUSES This condition is caused by an allergic reaction. Common causes of an allergic reaction (allergens) include: 5. Dust. 6. Pollen. 7. Mold. 8. Animal dander or secretions. RISK FACTORS This condition is more likely to develop if you are exposed to high levels of allergens that cause the allergic reaction. This might include being outdoors when air pollen levels are high or being around animals that you are allergic to. SYMPTOMS Symptoms of this condition may include: 8. Eye redness. 9. Tearing of the eyes. 10. Watery eyes. 11. Itchy eyes. 12. Burning feeling in the eyes. 13. Clear drainage from the eyes. 14. Swollen eyelids. DIAGNOSIS This condition may be diagnosed by medical history and physical exam. If you have drainage from your eyes, it may be tested to rule out other causes of conjunctivitis. TREATMENT Treatment for this condition often includes  medicines. These may be eye drops, ointments, or oral medicines. They may be prescription medicines or over-the-counter medicines. HOME CARE INSTRUCTIONS  Take or apply medicines only as directed by your health  care provider.  Do not touch or rub your eyes.  Do not wear contact lenses until the inflammation is gone. Wear glasses instead.  Do not wear eye makeup until the inflammation is gone.  Apply a cool, clean washcloth to your eye for 10-20 minutes, 3-4 times a day.  Try to avoid whatever allergen is causing the allergic reaction. SEEK MEDICAL CARE IF:  Your symptoms get worse.  You have pus draining from your eye.  You have new symptoms.  You have a fever.   This information is not intended to replace advice given to you by your health care provider. Make sure you discuss any questions you have with your health care provider.   Document Released: 11/30/2002 Document Revised: 09/30/2014 Document Reviewed: 06/21/2014 Elsevier Interactive Patient Education 2016 [employer]  How to Use Eye Drops and Eye Ointments HOW TO APPLY EYE DROPS Follow these steps when applying eye drops: 9. Wash your hands. 10. Tilt your head back. 11. Put a finger under your eye and use it to gently pull your lower lid downward. Keep that finger in place. 12. Using your other hand, hold the dropper between your thumb and index finger. 13. Position the dropper just over the edge of the lower lid. Hold it as close to your eye as you can without touching the dropper to your eye. 14. Steady your hand. One way to do this is to lean your index finger against your brow. 15. Look up. 16. Slowly and gently squeeze one drop of medicine into your eye. 17. Close your eye. 18. Place a finger between your lower eyelid and your nose. Press gently for 2 minutes. This increases the amount of time that the medicine is exposed to the eye. It also reduces side effects that can develop if the drop gets into the bloodstream through the nose. HOW TO APPLY EYE OINTMENTS Follow these steps when applying eye ointments: 15. Wash your hands. 16. Put a finger under your eye and use it to gently pull your lower lid downward. Keep  that finger in place. 17. Using your other hand, place the tip of the tube between your thumb and index finger with the remaining fingers braced against your cheek or nose. 18. Hold the tube just over the edge of your lower lid without touching the tube to your lid or eyeball. 19. Look up. 20. Line the inner part of your lower lid with ointment. 21. Gently pull up on your upper lid and look down. This will force the ointment to spread over the surface of the eye. 22. Release the upper lid. 23. If you can, close your eyes for 1-2 minutes. Do not rub your eyes. If you applied the ointment correctly, your vision will be blurry for a few minutes. This is normal. ADDITIONAL INFORMATION  Make sure to use the eye drops or ointment as told by your health care provider.  If you have been told to use both eye drops and an eye ointment, apply the eye drops first, then wait 3-4 minutes before you apply the ointment.  Try not to touch the tip of the dropper or tube to your eye. A dropper or tube that has touched the eye can become contaminated.   This information is not intended to replace advice given  to you by your health care provider. Make sure you discuss any questions you have with your health care provider.   Document Released: 12/16/2000 Document Revised: 01/24/2015 Document Reviewed: 09/05/2014 Elsevier Interactive Patient Education [employer]

## 2016-02-29 ENCOUNTER — Encounter: Payer: Self-pay | Admitting: *Deleted

## 2016-02-29 ENCOUNTER — Ambulatory Visit
Admission: EM | Admit: 2016-02-29 | Discharge: 2016-02-29 | Disposition: A | Discharge status: Home / Self Care | Payer: BLUE CROSS/BLUE SHIELD | Attending: Family Medicine | Admitting: Family Medicine

## 2016-02-29 DIAGNOSIS — K120 Recurrent oral aphthae: Secondary | ICD-10-CM | POA: Diagnosis not present

## 2016-02-29 HISTORY — DX: Depression, unspecified: F32.A

## 2016-02-29 HISTORY — DX: Major depressive disorder, single episode, unspecified: F32.9

## 2016-02-29 HISTORY — DX: Anxiety disorder, unspecified: F41.9

## 2016-02-29 MED ORDER — VITAMIN B-12 1000 MCG PO TABS: 1000.0000 ug | ORAL_TABLET | Freq: Every day | ORAL | Status: AC

## 2016-02-29 MED ORDER — IBUPROFEN 800 MG PO TABS: 800.0000 mg | ORAL_TABLET | Freq: Three times a day (TID) | ORAL | Status: DC

## 2016-02-29 NOTE — ED Provider Notes (Signed)
CSN: [id number]     Arrival date & time 02/29/16  1011 History   First MD Initiated Contact with Patient 02/29/16 1052     Chief Complaint  Patient presents with  . Mouth Lesions   (Consider location/radiation/quality/duration/timing/severity/associated sxs/prior Treatment) HPI   This a 35 year old female who presents to day history of cold sores her lower buccal surface gumline. She noticed it after having a crushed sac coming contact with the areas and hurt "immediately". Since that time she has had very painful areas on her lower gumline extending even back towards the molar spot bilaterally. Eating food is very painful and she has been basically on a liquid diet. He states that she initially was taking the Prozac cold but is having trouble swallowing the complete pill and feels like it gets stuck in her throat. Resorted to crushing the tablet which caused the severe pain. She does states she has been under a great deal of stress lately.     Past Medical History  Diagnosis Date  . Concussion   . Depression   . Anxiety    No past surgical history on file. Family History  Problem Relation Age of Onset  . Bipolar disorder Mother   . Parkinson's disease Father    Social History  Substance Use Topics  . Smoking status: Never Smoker   . Smokeless tobacco: Never Used  . Alcohol Use: No   OB History    No data available     Review of Systems  Constitutional: Positive for activity change and appetite change. Negative for fever, chills and fatigue.  HENT: Positive for dental problem.   All other systems reviewed and are negative.   Allergies  Naproxen and Prevacid  Home Medications   Prior to Admission medications   Medication Sig Start Date End Date Taking? Authorizing Provider  FLUoxetine (PROZAC) 20 MG tablet Take 20 mg by mouth daily.   Yes Historical Provider, MD  cephALEXin (KEFLEX) 500 MG capsule Take 1 capsule (500 mg total) by mouth 4 (four) times daily. 04/27/15    Tommi Rumps, PA-C  Cetirizine HCl 10 MG CAPS Take 1 capsule (10 mg total) by mouth once. Use daily during pollen season 12/28/15   Lutricia Feil, PA-C  cromolyn (OPTICROM) 4 % ophthalmic solution Place 2 drops into both eyes 4 (four) times daily. 12/28/15   Lutricia Feil, PA-C  diazepam (VALIUM) 2 MG tablet Take 1 tablet (2 mg total) by mouth every 8 (eight) hours as needed for muscle spasms. 04/27/15   Tommi Rumps, PA-C  Epinastine HCl 0.05 % ophthalmic solution Place 1 drop into both eyes 2 (two) times daily. 12/28/15   Lutricia Feil, PA-C  ibuprofen (ADVIL,MOTRIN) 800 MG tablet Take 1 tablet (800 mg total) by mouth 3 (three) times daily. 02/29/16   Lutricia Feil, PA-C  ondansetron (ZOFRAN) 4 MG tablet Take 1 tablet (4 mg total) by mouth every 8 (eight) hours as needed for nausea or vomiting. 04/07/15   Irean Hong, MD  oxyCODONE-acetaminophen (ROXICET) 5-325 MG tablet Take 1 tablet by mouth every 4 (four) hours as needed for severe pain. 10/06/15   Irean Hong, MD  silver sulfADIAZINE (SILVADENE) 1 % cream Apply to affected area twice daily 10/06/15 10/05/16  Irean Hong, MD  vitamin B-12 (CYANOCOBALAMIN) 1000 MCG tablet Take 1 tablet (1,000 mcg total) by mouth daily. 02/29/16   Lutricia Feil, PA-C   Meds Ordered and Administered this Visit  Medications -  No data to display  BP 124/76 mmHg  Pulse 88  Temp(Src) 98 F (36.7 C) (Oral)  Ht 5\' 3"  (1.6 m)  Wt 145 lb (65.772 kg)  BMI 25.69 kg/m2  SpO2 99%  LMP 02/22/2016 (Exact Date) No data found.   Physical Exam  Constitutional: She appears well-developed and well-nourished. No distress.  HENT:  Head: Normocephalic and atraumatic.  Nose: Nose normal.  Mouth/Throat: No oropharyngeal exudate.  Examination of the oral mucosa shows several areas at the junction of the gum and buccal surface of the cheek in the lips that are shallow ulcerated areas a few that are confluent particularly up towards the molar. There is no other lesions  seen on the buccal surface of the cheek or upper dentition. The oropharynx is benign. Is no adenopathy appreciated. Is no other cutaneous lesions noticed on the body.  Eyes: Conjunctivae are normal. Pupils are equal, round, and reactive to light.  Neck: Normal range of motion. Neck supple.  Musculoskeletal: Normal range of motion.  Neurological: She is alert.  Skin: Skin is warm and dry. She is not diaphoretic.  Psychiatric: She has a normal mood and affect. Her behavior is normal. Judgment and thought content normal.  Nursing note and vitals reviewed.   ED Course  Procedures (including critical care time)  Labs Review Labs Reviewed - No data to display  Imaging Review No results found.   Visual Acuity Review  Right Eye Distance:   Left Eye Distance:   Bilateral Distance:    Right Eye Near:   Left Eye Near:    Bilateral Near:         MDM   1. Aphthous stomatitis    Discharge Medication List as of 02/29/2016 11:27 AM    START taking these medications   Details  vitamin B-12 (CYANOCOBALAMIN) 1000 MCG tablet Take 1 tablet (1,000 mcg total) by mouth daily., Starting 02/29/2016, Until Discontinued, Normal      A prescription for Kenalog in [place] was also phoned personally by myself to [facility] [place].  Plan: 1. Test/x-ray results and diagnosis reviewed with patient 2. rx as per orders; risks, benefits, potential side effects reviewed with patient 3. Recommend supportive treatment with continued hydration. He is to be careful with acidic type of fluids or food not exacerbate her problem. Also recommended vitamin B12 1000 g daily. She is allergic to Naprosyn causing anaphylactoid reactions but she is not allergic to ibuprofen which she has taken in the past. This was specifically discussed with the patient she confirmed. If she is not improving in several days or if there is worsening she should go the emergency department or be seen by a dermatologist whose name I provided.  I told her that I do not believe that the Prozac was the cause of the stomatitis that she should contact the person who prescribed the Prozac recently to confirm 4. F/u prn if symptoms worsen or don't improve   Lutricia FeilWilliam P Roemer, PA-C 02/29/16 1148

## 2016-02-29 NOTE — ED Notes (Signed)
Pt present with complaint of oral sores that presented 2 days ago after taking crushed medication. Sore are located in lower gums.

## 2016-02-29 NOTE — Discharge Instructions (Signed)
Canker Sores °Canker sores are small, painful sores that develop inside your mouth. They may also be called aphthous ulcers. You can get canker sores on the inside of your lips or cheeks, on your tongue, or anywhere inside your mouth. You can have just one canker sore or several of them. Canker sores cannot be passed from one person to another (noncontagious). These sores are different than the sores that you may get on the outside of your lips (cold sores or fever blisters). °Canker sores usually start as painful red bumps. Then they turn into small white, yellow, or gray ulcers that have red borders. The ulcers may be quite painful. The pain may be worse when you eat or drink. °CAUSES °The cause of this condition is not known. °RISK FACTORS °This condition is more likely to develop in: °· Women. °· People in their teens or 20s. °· Women who are having their menstrual period. °· People who are under a lot of emotional stress. °· People who do not get enough iron or B vitamins. °· People who have poor oral hygiene. °· People who have an injury inside the mouth. This can happen after having dental work or from chewing something hard. °SYMPTOMS °Along with the canker sore, symptoms may also include: °· Fever. °· Fatigue. °· Swollen lymph nodes in your neck. °DIAGNOSIS °This condition can be diagnosed based on your symptoms. Your health care provider will also examine your mouth. Your health care provider may also do tests if you get canker sores often or if they are very bad. Tests may include: °· Blood tests to rule out other causes of canker sores. °· Taking swabs from the sore to check for infection. °· Taking a small piece of skin from the sore (biopsy) to test it for cancer. °TREATMENT °Most canker sores clear up without treatment in about 10 days. Home care is usually the only treatment that you will need. Over-the-counter medicines can relieve discomfort. If you have severe canker sores, your health care  provider may prescribe: °· Numbing ointment to relieve pain. °· Vitamins. °· Steroid medicines. These may be given as: °¨ Oral pills. °¨ Mouth rinses. °¨ Gels. °· Antibiotic mouth rinse. °HOME CARE INSTRUCTIONS °· Apply, take, or use medicines only as directed by your health care provider. These include vitamins. °· If you were prescribed an antibiotic mouth rinse, finish all of it even if you start to feel better. °· Until the sores are healed: °¨ Do not drink coffee or citrus juices. °¨ Do not eat spicy or salty foods. °· Use a mild, over-the-counter mouth rinse as directed by your health care provider. °· Practice good oral hygiene. °¨ Floss your teeth every day. °¨ Brush your teeth with a soft brush twice each day. °SEEK MEDICAL CARE IF: °· Your symptoms do not get better after two weeks. °· You also have a fever or swollen glands. °· You get canker sores often. °· You have a canker sore that is getting larger. °· You cannot eat or drink due to your canker sores. °  °This information is not intended to replace advice given to you by your health care provider. Make sure you discuss any questions you have with your health care provider. °  °Document Released: 01/04/2011 Document Revised: 01/24/2015 Document Reviewed: 08/10/2014 °Elsevier Interactive Patient Education ©2016 Elsevier Inc. ° °

## 2016-05-20 ENCOUNTER — Encounter: Payer: Self-pay | Admitting: Emergency Medicine

## 2016-05-20 ENCOUNTER — Ambulatory Visit
Admission: EM | Admit: 2016-05-20 | Discharge: 2016-05-20 | Disposition: A | Discharge status: Home / Self Care | Payer: BLUE CROSS/BLUE SHIELD | Attending: Family Medicine | Admitting: Family Medicine

## 2016-05-20 DIAGNOSIS — G43009 Migraine without aura, not intractable, without status migrainosus: Secondary | ICD-10-CM

## 2016-05-20 MED ORDER — ONDANSETRON 8 MG PO TBDP: 8.0000 mg | ORAL_TABLET | Freq: Two times a day (BID) | ORAL | 0 refills | Status: AC

## 2016-05-20 MED ORDER — ONDANSETRON 8 MG PO TBDP
8.0000 mg | ORAL_TABLET | Freq: Once | ORAL | Status: AC
  Administered 2016-05-20: 8 mg via ORAL

## 2016-05-20 MED ORDER — SUMATRIPTAN SUCCINATE 6 MG/0.5ML ~~LOC~~ SOLN
6.0000 mg | Freq: Once | SUBCUTANEOUS | Status: AC
  Administered 2016-05-20: 6 mg via SUBCUTANEOUS

## 2016-05-20 MED ORDER — HYDROCODONE-ACETAMINOPHEN 5-325 MG PO TABS: ORAL_TABLET | ORAL | 0 refills | Status: AC

## 2016-05-20 NOTE — ED Triage Notes (Signed)
Patient c/o migraine headache that started this morning.  Patient reports vomiting.

## 2016-05-20 NOTE — ED Notes (Signed)
Patient shows no signs of adverse reaction to medication at this time.  

## 2016-05-20 NOTE — ED Provider Notes (Signed)
[facility]    CSN: [id number] Arrival date & time: 05/20/16  1857  First Provider Contact:  First MD Initiated Contact with Patient 05/20/16 1940        History   Chief Complaint Chief Complaint  Patient presents with  . Headache    HPI Shannon Phelps is a 35 y.o. female.   The history is provided by the patient.  Headache  Pain location:  L temporal Quality:  Stabbing Radiates to:  Does not radiate Severity currently:  8/10 Severity at highest:  8/10 Onset quality:  Sudden Duration:  10 hours Timing:  Constant Chronicity:  New Similar to prior headaches: yes   Context: bright light and emotional stress   Relieved by:  Nothing Ineffective treatments:  Acetaminophen Associated symptoms: photophobia and vomiting   Associated symptoms: no abdominal pain, no blurred vision, no ear pain, no eye pain, no facial pain, no fever, no focal weakness, no hearing loss, no neck pain, no neck stiffness, no numbness, no paresthesias, no sore throat, no swollen glands, no syncope and no visual change     Past Medical History:  Diagnosis Date  . Anxiety   . Concussion   . Depression     There are no active problems to display for this patient.   History reviewed. No pertinent surgical history.  OB History    No data available       Home Medications    Prior to Admission medications   Medication Sig Start Date End Date Taking? Authorizing Provider  Cetirizine HCl 10 MG CAPS Take 1 capsule (10 mg total) by mouth once. Use daily during pollen season 12/28/15   Lutricia Feil, PA-C  HYDROcodone-acetaminophen (NORCO/VICODIN) 5-325 MG tablet 1-2 tabs po q 8 hours prn 05/20/16   Payton Mccallum, MD  ibuprofen (ADVIL,MOTRIN) 800 MG tablet Take 1 tablet (800 mg total) by mouth 3 (three) times daily. 02/29/16   Lutricia Feil, PA-C  ondansetron (ZOFRAN ODT) 8 MG disintegrating tablet Take 1 tablet (8 mg total) by mouth 2 (two) times daily. 05/20/16   Payton Mccallum, MD    ondansetron (ZOFRAN) 4 MG tablet Take 1 tablet (4 mg total) by mouth every 8 (eight) hours as needed for nausea or vomiting. 04/07/15   Irean Hong, MD  vitamin B-12 (CYANOCOBALAMIN) 1000 MCG tablet Take 1 tablet (1,000 mcg total) by mouth daily. 02/29/16   Lutricia Feil, PA-C    Family History Family History  Problem Relation Age of Onset  . Bipolar disorder Mother   . Parkinson's disease Father     Social History Social History  Substance Use Topics  . Smoking status: Never Smoker  . Smokeless tobacco: Never Used  . Alcohol use No     Allergies   Naproxen and Prevacid [lansoprazole]   Review of Systems Review of Systems  Constitutional: Negative for fever.  HENT: Negative for ear pain, hearing loss and sore throat.   Eyes: Positive for photophobia. Negative for blurred vision and pain.  Cardiovascular: Negative for syncope.  Gastrointestinal: Positive for vomiting. Negative for abdominal pain.  Musculoskeletal: Negative for neck pain and neck stiffness.  Neurological: Positive for headaches. Negative for focal weakness, numbness and paresthesias.     Physical Exam Triage Vital Signs ED Triage Vitals  Enc Vitals Group     BP 05/20/16 1909 113/81     Pulse Rate 05/20/16 1909 76     Resp 05/20/16 1909 16     Temp 05/20/16 1909  97.2 F (36.2 C)     Temp Source 05/20/16 1909 Tympanic     SpO2 05/20/16 1909 100 %     Weight 05/20/16 1910 145 lb (65.8 kg)     Height 05/20/16 1910 5\' 3"  (1.6 m)     Head Circumference --      Peak Flow --      Pain Score 05/20/16 1912 9     Pain Loc --      Pain Edu? --      Excl. in GC? --    No data found.   Updated Vital Signs BP 113/81 (BP Location: Left Arm)   Pulse 76   Temp 97.2 F (36.2 C) (Tympanic)   Resp 16   Ht 5\' 3"  (1.6 m)   Wt 145 lb (65.8 kg)   LMP 05/06/2016 (Approximate)   SpO2 100%   BMI 25.69 kg/m   Visual Acuity Right Eye Distance:   Left Eye Distance:   Bilateral Distance:    Right Eye  Near:   Left Eye Near:    Bilateral Near:     Physical Exam  Constitutional: She is oriented to person, place, and time. She appears well-developed and well-nourished. No distress.  HENT:  Head: Normocephalic.  Right Ear: Tympanic membrane, external ear and ear canal normal.  Left Ear: Tympanic membrane, external ear and ear canal normal.  Nose: Nose normal.  Mouth/Throat: Oropharynx is clear and moist and mucous membranes are normal.  Eyes: Conjunctivae and EOM are normal. Pupils are equal, round, and reactive to light. Right eye exhibits no discharge. Left eye exhibits no discharge. No scleral icterus.  Neck: Normal range of motion. Neck supple. No JVD present. No tracheal deviation present. No thyromegaly present.  Cardiovascular: Normal rate, regular rhythm, normal heart sounds and intact distal pulses.   No murmur heard. Pulmonary/Chest: Effort normal and breath sounds normal. No stridor. No respiratory distress.  Abdominal: She exhibits no distension and no mass. There is no tenderness. There is no rebound and no guarding.  Musculoskeletal: She exhibits no edema or tenderness.  Lymphadenopathy:    She has no cervical adenopathy.  Neurological: She is alert and oriented to person, place, and time. She has normal reflexes. She displays normal reflexes. No cranial nerve deficit. She exhibits normal muscle tone. Coordination normal.  Skin: Skin is warm and dry. No rash noted. She is not diaphoretic. No erythema. No pallor.  Vitals reviewed.    UC Treatments / Results  Labs (all labs ordered are listed, but only abnormal results are displayed) Labs Reviewed - No data to display  EKG  EKG Interpretation None       Radiology No results found.  Procedures Procedures (including critical care time)  Medications Ordered in UC Medications  ondansetron (ZOFRAN-ODT) disintegrating tablet 8 mg (not administered)  SUMAtriptan (IMITREX) injection 6 mg (6 mg Subcutaneous Given  05/20/16 1955)     Initial Impression / Assessment and Plan / UC Course  I have reviewed the triage vital signs and the nursing notes.  Pertinent labs & imaging results that were available during my care of the patient were reviewed by me and considered in my medical decision making (see chart for details).  Clinical Course      Final Clinical Impressions(s) / UC Diagnoses   Final diagnoses:  Migraine without aura and without status migrainosus, not intractable    New Prescriptions New Prescriptions   HYDROCODONE-ACETAMINOPHEN (NORCO/VICODIN) 5-325 MG TABLET    1-2 tabs po  q 8 hours prn   ONDANSETRON (ZOFRAN ODT) 8 MG DISINTEGRATING TABLET    Take 1 tablet (8 mg total) by mouth 2 (two) times daily.   1.  diagnosis reviewed with patient 2. rx as per orders above; reviewed possible side effects, interactions, risks and benefits  3. Patient given zofran 8mg  odt and imitrex 6mg  sq  4. Follow-up prn if symptoms worsen or don't improve   Payton Mccallum, MD 05/20/16 1958

## 2016-06-17 ENCOUNTER — Encounter: Payer: Self-pay | Admitting: *Deleted

## 2016-06-17 ENCOUNTER — Emergency Department
Admission: EM | Admit: 2016-06-17 | Discharge: 2016-06-17 | Disposition: A | Discharge status: Home / Self Care | Payer: BLUE CROSS/BLUE SHIELD | Attending: Emergency Medicine | Admitting: Emergency Medicine

## 2016-06-17 DIAGNOSIS — Y929 Unspecified place or not applicable: Secondary | ICD-10-CM | POA: Diagnosis not present

## 2016-06-17 DIAGNOSIS — Y939 Activity, unspecified: Secondary | ICD-10-CM | POA: Diagnosis not present

## 2016-06-17 DIAGNOSIS — Z79899 Other long term (current) drug therapy: Secondary | ICD-10-CM | POA: Insufficient documentation

## 2016-06-17 DIAGNOSIS — Z791 Long term (current) use of non-steroidal anti-inflammatories (NSAID): Secondary | ICD-10-CM | POA: Diagnosis not present

## 2016-06-17 DIAGNOSIS — W540XXA Bitten by dog, initial encounter: Secondary | ICD-10-CM | POA: Diagnosis not present

## 2016-06-17 DIAGNOSIS — Y999 Unspecified external cause status: Secondary | ICD-10-CM | POA: Diagnosis not present

## 2016-06-17 DIAGNOSIS — S91351A Open bite, right foot, initial encounter: Secondary | ICD-10-CM | POA: Insufficient documentation

## 2016-06-17 MED ORDER — IBUPROFEN 600 MG PO TABS: 600.0000 mg | ORAL_TABLET | Freq: Three times a day (TID) | ORAL | 0 refills | Status: AC | PRN

## 2016-06-17 NOTE — Discharge Instructions (Signed)
Continue taking medication as prescribed, Augmentin and hydrocodone as needed for pain. Begin taking ibuprofen 600 mg 3 times a day with food for inflammation. Wear post-op shoe for support. Continue wound care as instructed by the emergency room in [place].  Watch for any signs of infection. Make an appointment with Dr. Alberteen Spindleline for continued care of your foot injury.

## 2016-06-17 NOTE — ED Provider Notes (Signed)
[facility] Emergency Department Provider Note  ____________________________________________   First MD Initiated Contact with Patient 06/17/16 1201     (approximate)  I have reviewed the triage vital signs and the nursing notes.   HISTORY  Chief Complaint Animal Bite   HPI Shannon Phelps is a 35 y.o. female is here with complaint of foot pain. Patient states that she was bit by a friend's dog 3 days ago. Patient states that she was seen in an emergency room in [place] where she had her foot x-rayed, antibiotics, and instructions on how to take care of her foot. She states that there was no foreign bodies and no broken bones on her x-ray. She has continued to take Augmentin and hydrocodone for pain. She states that the swelling has increased somewhat and that there is a "numbness" in her right foot that is concerning to her. She states the numbness sensation radiates up her foot. Patient was up-to-date on her immunizations prior to this event. Pain currently as 6/10. Patient continues to ambulate. She states that at work she mostly is on her feet and is concerned about this as well.   Past Medical History:  Diagnosis Date  . Anxiety   . Concussion   . Depression     There are no active problems to display for this patient.   History reviewed. No pertinent surgical history.  Prior to Admission medications   Medication Sig Start Date End Date Taking? Authorizing Provider  Cetirizine HCl 10 MG CAPS Take 1 capsule (10 mg total) by mouth once. Use daily during pollen season 12/28/15   Lutricia FeilWilliam P Roemer, PA-C  HYDROcodone-acetaminophen (NORCO/VICODIN) 5-325 MG tablet 1-2 tabs po q 8 hours prn 05/20/16   Payton Mccallumrlando Conty, MD  ibuprofen (ADVIL,MOTRIN) 600 MG tablet Take 1 tablet (600 mg total) by mouth every 8 (eight) hours as needed. 06/17/16   Tommi Rumpshonda L Jaelin Devincentis, PA-C  ondansetron (ZOFRAN ODT) 8 MG disintegrating tablet Take 1 tablet (8 mg total) by mouth 2 (two) times  daily. 05/20/16   Payton Mccallumrlando Conty, MD  ondansetron (ZOFRAN) 4 MG tablet Take 1 tablet (4 mg total) by mouth every 8 (eight) hours as needed for nausea or vomiting. 04/07/15   Irean HongJade J Sung, MD  vitamin B-12 (CYANOCOBALAMIN) 1000 MCG tablet Take 1 tablet (1,000 mcg total) by mouth daily. 02/29/16   Lutricia FeilWilliam P Roemer, PA-C    Allergies Naproxen and Prevacid [lansoprazole]  Family History  Problem Relation Age of Onset  . Bipolar disorder Mother   . Parkinson's disease Father     Social History Social History  Substance Use Topics  . Smoking status: Never Smoker  . Smokeless tobacco: Never Used  . Alcohol use No    Review of Systems Constitutional: No fever/chills Cardiovascular: Denies chest pain. Respiratory: Denies shortness of breath. Gastrointestinal:  No nausea, no vomiting.   Musculoskeletal: Negative for back pain. Skin: Positive for dog bite. Neurological: Negative for headaches, focal weakness.  Positive numbness right foot.  10-point ROS otherwise negative.  ____________________________________________   PHYSICAL EXAM:  VITAL SIGNS: ED Triage Vitals [06/17/16 1129]  Enc Vitals Group     BP (!) 122/94     Pulse Rate 88     Resp 20     Temp 98 F (36.7 C)     Temp Source Oral     SpO2 100 %     Weight 145 lb (65.8 kg)     Height 5\' 3"  (1.6 m)  Head Circumference      Peak Flow      Pain Score 6     Pain Loc      Pain Edu?      Excl. in GC?     Constitutional: Alert and oriented. Well appearing and in no acute distress. Eyes: Conjunctivae are normal. PERRL. EOMI. Head: Atraumatic. Nose: No congestion/rhinnorhea. Neck: No stridor.   Cardiovascular: Normal rate, regular rhythm. Grossly normal heart sounds.  Good peripheral circulation. Respiratory: Normal respiratory effort.  No retractions. Lungs CTAB. Musculoskeletal: On examination of the dorsum of the right foot there is moderate soft tissue swelling and tenderness on palpation. There appears to be 2  very superficial abrasions without drainage from the area. There is no evidence of recent bleeding. Pulses present. Digits distal to the injury happened decreased sensation per patient. Patient is able to have normal range of motion of her toes without any difficulty. There is no injury to the plantar aspect of her foot. Patient's gait is slow and guarded. Neurologic:  Normal speech and language. No gross focal neurologic deficits are appreciated.  Skin:  Skin is warm, dry.  Exam as above without evidence of infection. Moderate ecchymosis and soft tissue swelling present. Psychiatric: Mood and affect are normal. Speech and behavior are normal.  ____________________________________________   LABS (all labs ordered are listed, but only abnormal results are displayed)  Labs Reviewed - No data to display   PROCEDURES  Procedure(s) performed: None  Procedures  Critical Care performed: No  ____________________________________________   INITIAL IMPRESSION / ASSESSMENT AND PLAN / ED COURSE  Pertinent labs & imaging results that were available during my care of the patient were reviewed by me and considered in my medical decision making (see chart for details).    Clinical Course   Patient is continue ice and elevation. She is also to continue hydrocodone as well as her Augmentin. She is to follow-up with Dr. Alberteen Spindle.  Patient was also placed in a post op shoe for more support. She'll return to the emergency room if any signs of infection. She was also given a work note.  ____________________________________________   FINAL CLINICAL IMPRESSION(S) / ED DIAGNOSES  Final diagnoses:  Dog bite of right foot, initial encounter      NEW MEDICATIONS STARTED DURING THIS VISIT:  Discharge Medication List as of 06/17/2016 12:43 PM       Note:  This document was prepared using Dragon voice recognition software and may include unintentional dictation errors.    Tommi Rumps,  PA-C 06/17/16 1321    Myrna Blazer, MD 06/17/16 [phone]

## 2016-06-17 NOTE — ED Triage Notes (Signed)
Pt was bit by a dog on Friday, [place], pt reports increased pain and bruising, right foot is swollen and bruised

## 2016-06-17 NOTE — ED Notes (Signed)
Post op shoe placed on right foot. Splint applied per order. Patient in stable condition. Cap refill <2 sec; palpable pulse. Education regarding s/sx of adverse reaction complete; patient verbalizes understanding of need to remove splint and seek medical attention for severe pain, tingling, numbness, distal swelling, pallor, coldness, or cyanosis.

## 2016-10-15 ENCOUNTER — Emergency Department: Payer: BLUE CROSS/BLUE SHIELD

## 2016-10-15 ENCOUNTER — Emergency Department
Admission: EM | Admit: 2016-10-15 | Discharge: 2016-10-15 | Disposition: A | Discharge status: Home / Self Care | Payer: BLUE CROSS/BLUE SHIELD | Attending: Emergency Medicine | Admitting: Emergency Medicine

## 2016-10-15 ENCOUNTER — Encounter: Payer: Self-pay | Admitting: Emergency Medicine

## 2016-10-15 DIAGNOSIS — Z791 Long term (current) use of non-steroidal anti-inflammatories (NSAID): Secondary | ICD-10-CM | POA: Insufficient documentation

## 2016-10-15 DIAGNOSIS — J111 Influenza due to unidentified influenza virus with other respiratory manifestations: Secondary | ICD-10-CM | POA: Diagnosis not present

## 2016-10-15 DIAGNOSIS — R509 Fever, unspecified: Secondary | ICD-10-CM | POA: Diagnosis present

## 2016-10-15 LAB — URINALYSIS, COMPLETE (UACMP) WITH MICROSCOPIC
BACTERIA UA: NONE SEEN
Bilirubin Urine: NEGATIVE
Glucose, UA: NEGATIVE mg/dL
HGB URINE DIPSTICK: NEGATIVE
Ketones, ur: 5 mg/dL — AB
LEUKOCYTES UA: NEGATIVE
NITRITE: NEGATIVE
PH: 5 (ref 5.0–8.0)
Protein, ur: NEGATIVE mg/dL
SPECIFIC GRAVITY, URINE: 1.027 (ref 1.005–1.030)

## 2016-10-15 LAB — COMPREHENSIVE METABOLIC PANEL
ALBUMIN: 4.1 g/dL (ref 3.5–5.0)
ALK PHOS: 48 U/L (ref 38–126)
ALT: 84 U/L — AB (ref 14–54)
AST: 62 U/L — AB (ref 15–41)
Anion gap: 9 (ref 5–15)
BILIRUBIN TOTAL: 0.8 mg/dL (ref 0.3–1.2)
BUN: 7 mg/dL (ref 6–20)
CO2: 25 mmol/L (ref 22–32)
CREATININE: 0.78 mg/dL (ref 0.44–1.00)
Calcium: 8.7 mg/dL — ABNORMAL LOW (ref 8.9–10.3)
Chloride: 103 mmol/L (ref 101–111)
GFR calc Af Amer: >60 mL/min (ref >60)
GLUCOSE: 104 mg/dL — AB (ref 65–99)
Potassium: 3.5 mmol/L (ref 3.5–5.1)
Sodium: 137 mmol/L (ref 135–145)
TOTAL PROTEIN: 7.1 g/dL (ref 6.5–8.1)

## 2016-10-15 LAB — LIPASE, BLOOD: Lipase: 25 U/L (ref 11–51)

## 2016-10-15 LAB — CBC
HEMATOCRIT: 38.3 % (ref 35.0–47.0)
Hemoglobin: 13.5 g/dL (ref 12.0–16.0)
MCH: 30.4 pg (ref 26.0–34.0)
MCHC: 35.2 g/dL (ref 32.0–36.0)
MCV: 86.3 fL (ref 80.0–100.0)
PLATELETS: 223 10*3/uL (ref 150–440)
RBC: 4.44 MIL/uL (ref 3.80–5.20)
RDW: 13.2 % (ref 11.5–14.5)
WBC: 6.9 10*3/uL (ref 3.6–11.0)

## 2016-10-15 LAB — POCT PREGNANCY, URINE: Preg Test, Ur: NEGATIVE

## 2016-10-15 LAB — INFLUENZA PANEL BY PCR (TYPE A & B)
INFLAPCR: POSITIVE — AB
INFLBPCR: NEGATIVE

## 2016-10-15 MED ORDER — ONDANSETRON HCL 4 MG PO TABS: 4.0000 mg | ORAL_TABLET | Freq: Three times a day (TID) | ORAL | 0 refills | Status: AC | PRN

## 2016-10-15 MED ORDER — SODIUM CHLORIDE 0.9 % IV BOLUS (SEPSIS)
1000.0000 mL | Freq: Once | INTRAVENOUS | Status: AC
  Administered 2016-10-15: 1000 mL via INTRAVENOUS

## 2016-10-15 MED ORDER — BENZONATATE 100 MG PO CAPS: 100.0000 mg | ORAL_CAPSULE | Freq: Four times a day (QID) | ORAL | 0 refills | Status: AC | PRN

## 2016-10-15 MED ORDER — ONDANSETRON HCL 4 MG/2ML IJ SOLN
4.0000 mg | Freq: Once | INTRAMUSCULAR | Status: AC
  Administered 2016-10-15: 4 mg via INTRAVENOUS
  Filled 2016-10-15: qty 2

## 2016-10-15 NOTE — ED Notes (Signed)
Pt will be discharged after fluids are completed.

## 2016-10-15 NOTE — ED Notes (Signed)
Patient transported to X-ray 

## 2016-10-15 NOTE — ED Provider Notes (Signed)
[facility] Emergency Department Provider Note  ____________________________________________   I have reviewed the triage vital signs and the nursing notes.   HISTORY  Chief Complaint Fever and Emesis   History limited by: Not Limited   HPI Shannon Phelps is a 36 y.o. female who presents to the emergency department today because she has been feeling unwell. She states that she has not been feeling good for the past 4 days. She has had fevers. She has had body aches. She has had chest pain and shortness of breath. She feels like she might have another pneumonia. States that she gets pneumonias 4-5 times a year. Has not seen a specialist for this. In addition the patient states that her children both have the flu. The patient has been trying over-the-counter medications at home without any significant relief.    Past Medical History:  Diagnosis Date  . Anxiety   . Concussion   . Depression     There are no active problems to display for this patient.   History reviewed. No pertinent surgical history.  Prior to Admission medications   Medication Sig Start Date End Date Taking? Authorizing Provider  Cetirizine HCl 10 MG CAPS Take 1 capsule (10 mg total) by mouth once. Use daily during pollen season 12/28/15   Lutricia Feil, PA-C  HYDROcodone-acetaminophen (NORCO/VICODIN) 5-325 MG tablet 1-2 tabs po q 8 hours prn 05/20/16   Payton Mccallum, MD  ibuprofen (ADVIL,MOTRIN) 600 MG tablet Take 1 tablet (600 mg total) by mouth every 8 (eight) hours as needed. 06/17/16   Tommi Rumps, PA-C  ondansetron (ZOFRAN ODT) 8 MG disintegrating tablet Take 1 tablet (8 mg total) by mouth 2 (two) times daily. 05/20/16   Payton Mccallum, MD  ondansetron (ZOFRAN) 4 MG tablet Take 1 tablet (4 mg total) by mouth every 8 (eight) hours as needed for nausea or vomiting. 04/07/15   Irean Hong, MD  vitamin B-12 (CYANOCOBALAMIN) 1000 MCG tablet Take 1 tablet (1,000 mcg total) by mouth daily.  02/29/16   Lutricia Feil, PA-C    Allergies Naproxen and Prevacid [lansoprazole]  Family History  Problem Relation Age of Onset  . Bipolar disorder Mother   . Parkinson's disease Father     Social History Social History  Substance Use Topics  . Smoking status: Never Smoker  . Smokeless tobacco: Never Used  . Alcohol use No    Review of Systems  Constitutional: Positive for fever. Cardiovascular: Positive for chest pain. Respiratory: Positive for shortness of breath. Gastrointestinal: Negative for abdominal pain, vomiting and diarrhea. Genitourinary: Negative for dysuria. Musculoskeletal: Negative for back pain. Skin: Negative for rash. Neurological: Negative for headaches, focal weakness or numbness.  10-point ROS otherwise negative.  ____________________________________________   PHYSICAL EXAM:  VITAL SIGNS: ED Triage Vitals [10/15/16 0634]  Enc Vitals Group     BP 104/69     Pulse Rate 96     Resp 18     Temp 98.5 F (36.9 C)     Temp Source Oral     SpO2 97 %     Weight 150 lb (68 kg)     Height 5\' 3"  (1.6 m)     Head Circumference      Peak Flow      Pain Score 8    Constitutional: Alert and oriented. Well appearing and in no distress. Eyes: Conjunctivae are normal. Normal extraocular movements. ENT   Head: Normocephalic and atraumatic.   Nose: No congestion/rhinnorhea.  Mouth/Throat: Mucous membranes are moist.   Neck: No stridor. Hematological/Lymphatic/Immunilogical: No cervical lymphadenopathy. Cardiovascular: Normal rate, regular rhythm.  No murmurs, rubs, or gallops.  Respiratory: Normal respiratory effort without tachypnea nor retractions. Breath sounds are clear and equal bilaterally. No wheezes/rales/rhonchi. Gastrointestinal: Soft and non tender. No rebound. No guarding.  Genitourinary: Deferred Musculoskeletal: Normal range of motion in all extremities. No lower extremity edema. Neurologic:  Normal speech and language.  No gross focal neurologic deficits are appreciated.  Skin:  Skin is warm, dry and intact. No rash noted. Psychiatric: Mood and affect are normal. Speech and behavior are normal. Patient exhibits appropriate insight and judgment.  ____________________________________________    LABS (pertinent positives/negatives)  Labs Reviewed  COMPREHENSIVE METABOLIC PANEL - Abnormal; Notable for the following:       Result Value   Glucose, Bld 104 (*)    Calcium 8.7 (*)    AST 62 (*)    ALT 84 (*)    All other components within normal limits  URINALYSIS, COMPLETE (UACMP) WITH MICROSCOPIC - Abnormal; Notable for the following:    Color, Urine YELLOW (*)    APPearance CLEAR (*)    Ketones, ur 5 (*)    Squamous Epithelial / LPF 0-5 (*)    All other components within normal limits  INFLUENZA PANEL BY PCR (TYPE A & B) - Abnormal; Notable for the following:    Influenza A By PCR POSITIVE (*)    All other components within normal limits  LIPASE, BLOOD  CBC  POC URINE PREG, ED  POCT PREGNANCY, URINE     ____________________________________________   EKG  None  ____________________________________________    RADIOLOGY  CXR IMPRESSION:  Low lung volumes. Mild basilar interstitial prominence. Mild  pneumonitis cannot be excluded .   ____________________________________________   PROCEDURES  Procedures  ____________________________________________   INITIAL IMPRESSION / ASSESSMENT AND PLAN / ED COURSE  Pertinent labs & imaging results that were available during my care of the patient were reviewed by me and considered in my medical decision making (see chart for details).  Patient presented to the emergency department for feeling unwell. Chest x-ray was negative for pneumonia. Patient was positive for influenza. This point do not feel she would be a good candidate for Tamiflu given length of symptoms. Will discharge with antibiotics and cough  medicine.  ____________________________________________   FINAL CLINICAL IMPRESSION(S) / ED DIAGNOSES  Final diagnoses:  Influenza     Note: This dictation was prepared with Dragon dictation. Any transcriptional errors that result from this process are unintentional     Phineas SemenGraydon Winnifred Dufford, MD 10/15/16 [phone]

## 2016-10-15 NOTE — ED Notes (Signed)
Pt back from xray with NAD.

## 2016-10-15 NOTE — ED Triage Notes (Signed)
Pt ambulatory to triage with steady gait with c/o fever x 4 days and N/V since yesterday. Pt reports tmax (104) last dose of Tylenol was taken 2 hrs PTA. Pt denies diarrhea. Pt afebrile in triage.

## 2016-10-15 NOTE — Discharge Instructions (Signed)
Please seek medical attention for any high fevers, chest pain, shortness of breath, change in behavior, persistent vomiting, bloody stool or any other new or concerning symptoms.  

## 2018-04-18 ENCOUNTER — Emergency Department: Payer: BLUE CROSS/BLUE SHIELD

## 2018-04-18 ENCOUNTER — Emergency Department
Admission: EM | Admit: 2018-04-18 | Discharge: 2018-04-18 | Disposition: A | Discharge status: Home / Self Care | Payer: BLUE CROSS/BLUE SHIELD | Attending: Emergency Medicine | Admitting: Emergency Medicine

## 2018-04-18 DIAGNOSIS — F329 Major depressive disorder, single episode, unspecified: Secondary | ICD-10-CM | POA: Diagnosis not present

## 2018-04-18 DIAGNOSIS — Z79899 Other long term (current) drug therapy: Secondary | ICD-10-CM | POA: Diagnosis not present

## 2018-04-18 DIAGNOSIS — M79641 Pain in right hand: Secondary | ICD-10-CM | POA: Diagnosis present

## 2018-04-18 DIAGNOSIS — M25531 Pain in right wrist: Secondary | ICD-10-CM | POA: Insufficient documentation

## 2018-04-18 DIAGNOSIS — F419 Anxiety disorder, unspecified: Secondary | ICD-10-CM | POA: Insufficient documentation

## 2018-04-18 HISTORY — DX: Carpal tunnel syndrome, unspecified upper limb: G56.00

## 2018-04-18 MED ORDER — LIDOCAINE 5 % EX PTCH: 1.0000 | MEDICATED_PATCH | Freq: Two times a day (BID) | CUTANEOUS | 0 refills | Status: DC

## 2018-04-18 MED ORDER — TRAMADOL HCL 50 MG PO TABS
50.0000 mg | ORAL_TABLET | Freq: Once | ORAL | Status: AC
  Administered 2018-04-18: 50 mg via ORAL
  Filled 2018-04-18: qty 1

## 2018-04-18 MED ORDER — TRAMADOL HCL 50 MG PO TABS: 50.0000 mg | ORAL_TABLET | Freq: Four times a day (QID) | ORAL | 0 refills | Status: AC | PRN

## 2018-04-18 MED ORDER — METHYLPREDNISOLONE SODIUM SUCC 125 MG IJ SOLR
125.0000 mg | Freq: Once | INTRAMUSCULAR | Status: AC
  Administered 2018-04-18: 125 mg via INTRAMUSCULAR
  Filled 2018-04-18: qty 2

## 2018-04-18 MED ORDER — LIDOCAINE 5 % EX PTCH: 1.0000 | MEDICATED_PATCH | Freq: Two times a day (BID) | CUTANEOUS | 0 refills | Status: AC

## 2018-04-18 MED ORDER — LIDOCAINE 5 % EX PTCH
1.0000 | MEDICATED_PATCH | CUTANEOUS | Status: DC
  Administered 2018-04-18: 1 via TRANSDERMAL
  Filled 2018-04-18: qty 1

## 2018-04-18 NOTE — Discharge Instructions (Addendum)
Please follow-up with orthopedic surgery for further evaluation of your wrist pain.  Please return with any worse symptoms or any other concerns.

## 2018-04-18 NOTE — ED Triage Notes (Signed)
Patient c/o right hand pain X 1 week. Patient reports she remembers hitting her hand on an object. Patient reports increased pain and decreased range of motion beginning last night.

## 2018-04-18 NOTE — ED Provider Notes (Signed)
[facility] Emergency Department Provider Note   ____________________________________________   First MD Initiated Contact with Patient 04/18/18 [phone]     (approximate)  I have reviewed the triage vital signs and the nursing notes.   HISTORY  Chief Complaint Hand Pain    HPI Shannon Phelps is a 37 y.o. female who comes into the hospital today with some right hand pain.  The patient states that she has been working with a neurologist because she is been having paresthesias up and down her arms for some time.  She reports a week ago she smacked her hand and she has been having stabbing pain in her hand since then.  The patient states that she is losing function to her hand.  She does not want to just take medicine she wants to know what is going on.  The patient has a nerve study scheduled for the beginning of August and has been taking some vitamin supplements that may be contributing to her paresthesias.  The patient has been taking Tylenol for pain at home.  She states that moving her fingers or her wrist hurts.  The pain is the dorsum of her right hand.  The patient rates her pain a 9 out of 10 in intensity.   Past Medical History:  Diagnosis Date  . Anxiety   . Carpal tunnel syndrome   . Concussion   . Depression     There are no active problems to display for this patient.   History reviewed. No pertinent surgical history.  Prior to Admission medications   Medication Sig Start Date End Date Taking? Authorizing Provider  Cetirizine HCl 10 MG CAPS Take 1 capsule (10 mg total) by mouth once. Use daily during pollen season 12/28/15   Lutricia Feil, PA-C  HYDROcodone-acetaminophen (NORCO/VICODIN) 5-325 MG tablet 1-2 tabs po q 8 hours prn 05/20/16   Payton Mccallum, MD  ibuprofen (ADVIL,MOTRIN) 600 MG tablet Take 1 tablet (600 mg total) by mouth every 8 (eight) hours as needed. 06/17/16   Tommi Rumps, PA-C  lidocaine (LIDODERM) 5 % Place 1 patch onto  the skin every 12 (twelve) hours. Remove & Discard patch within 12 hours or as directed by MD 04/18/18 04/18/19  Rebecka Apley, MD  ondansetron (ZOFRAN ODT) 8 MG disintegrating tablet Take 1 tablet (8 mg total) by mouth 2 (two) times daily. 05/20/16   Payton Mccallum, MD  ondansetron (ZOFRAN) 4 MG tablet Take 1 tablet (4 mg total) by mouth every 8 (eight) hours as needed for nausea or vomiting. 04/07/15   Irean Hong, MD  ondansetron (ZOFRAN) 4 MG tablet Take 1 tablet (4 mg total) by mouth every 8 (eight) hours as needed for nausea or vomiting. 10/15/16   Phineas Semen, MD  traMADol (ULTRAM) 50 MG tablet Take 1 tablet (50 mg total) by mouth every 6 (six) hours as needed. 04/18/18   Rebecka Apley, MD  vitamin B-12 (CYANOCOBALAMIN) 1000 MCG tablet Take 1 tablet (1,000 mcg total) by mouth daily. 02/29/16   Lutricia Feil, PA-C    Allergies Naproxen and Prevacid [lansoprazole]  Family History  Problem Relation Age of Onset  . Bipolar disorder Mother   . Parkinson's disease Father     Social History Social History   Tobacco Use  . Smoking status: Never Smoker  . Smokeless tobacco: Never Used  Substance Use Topics  . Alcohol use: No  . Drug use: No    Review of Systems  Constitutional:  No fever/chills Eyes: No visual changes. ENT: No sore throat. Cardiovascular: Denies chest pain. Respiratory: Denies shortness of breath. Gastrointestinal: No abdominal pain.  No nausea, no vomiting.  No diarrhea.  No constipation. Genitourinary: Negative for dysuria. Musculoskeletal: Right hand pain Skin: Negative for rash. Neurological: Negative for headaches, focal weakness or numbness.   ____________________________________________   PHYSICAL EXAM:  VITAL SIGNS: ED Triage Vitals  Enc Vitals Group     BP 04/18/18 0528 119/87     Pulse Rate 04/18/18 0528 75     Resp 04/18/18 0528 18     Temp 04/18/18 0528 (!) 97.5 F (36.4 C)     Temp Source 04/18/18 0528 Oral     SpO2  04/18/18 0528 100 %     Weight 04/18/18 0524 148 lb (67.1 kg)     Height 04/18/18 0524 5\' 3"  (1.6 m)     Head Circumference --      Peak Flow --      Pain Score 04/18/18 0524 9     Pain Loc --      Pain Edu? --      Excl. in GC? --     Constitutional: Alert and oriented. Well appearing and in moderate distress. Eyes: Conjunctivae are normal. PERRL. EOMI. Head: Atraumatic. Nose: No congestion/rhinnorhea. Mouth/Throat: Mucous membranes are moist.  Oropharynx non-erythematous. Cardiovascular: Normal rate, regular rhythm. Grossly normal heart sounds.  Good peripheral circulation. Respiratory: Normal respiratory effort.  No retractions. Lungs CTAB. Gastrointestinal: Soft and nontender. No distention.  Positive bowel sounds Musculoskeletal: Some tenderness to palpation and mild soft tissue swelling over the right dorsum of hand near the wrist, pain with passive range of motion, patient cries on exam Neurologic:  Normal speech and language.  Skin:  Skin is warm, dry and intact.  Psychiatric: Mood and affect are normal.   ____________________________________________   LABS (all labs ordered are listed, but only abnormal results are displayed)  Labs Reviewed - No data to display ____________________________________________  EKG  none ____________________________________________  RADIOLOGY  ED MD interpretation:  DG right hand: Negative  Official radiology report(s): Dg Hand Complete Right  Result Date: 04/18/2018 CLINICAL DATA:  Right hand pain for 1 week. Struck hand on object. Increased pain and decreased range of motion beginning last night. EXAM: RIGHT HAND - COMPLETE 3+ VIEW COMPARISON:  None. FINDINGS: There is no evidence of fracture or dislocation. There is no evidence of arthropathy or other focal bone abnormality. Soft tissues are unremarkable. IMPRESSION: Negative. Electronically Signed   By: Burman Nieves M.D.   On: 04/18/2018 05:54     ____________________________________________   PROCEDURES  Procedure(s) performed: None  Procedures  Critical Care performed: No  ____________________________________________   INITIAL IMPRESSION / ASSESSMENT AND PLAN / ED COURSE  As part of my medical decision making, I reviewed the following data within the electronic MEDICAL RECORD NUMBER Notes from prior ED visits and [place] Controlled Substance Database   This is a 37 year old female who comes into the hospital today with some right hand pain after hitting her hand on an object.  The patient did receive an x-ray which did not show any fractures.  The patient does have some soft tissue swelling to the dorsum of the right hand near the wrist.  I will give the patient a shot of Solu-Medrol, a dose of tramadol, a Lidoderm patch and a wrist splint.  The patient should follow back up with orthopedic surgery as well as her neurologist for further evaluation.  ____________________________________________   FINAL CLINICAL IMPRESSION(S) / ED DIAGNOSES  Final diagnoses:  Right wrist pain     ED Discharge Orders        Ordered    traMADol (ULTRAM) 50 MG tablet  Every 6 hours PRN     04/18/18 0602    lidocaine (LIDODERM) 5 %  Every 12 hours     04/18/18 0602       Note:  This document was prepared using Dragon voice recognition software and may include unintentional dictation errors.    Rebecka ApleyWebster, Allison P, MD 04/18/18 [phone]

## 2018-09-04 IMAGING — DX DG HAND COMPLETE 3+V*R*
3 series · 3 of 3 positions shown · non-contrast
Comparison: None.

CLINICAL DATA: Right hand pain for 1 week. Struck hand on object.
Increased pain and decreased range of motion beginning last night.

EXAM:
RIGHT HAND - COMPLETE 3+ VIEW

[hand ap]
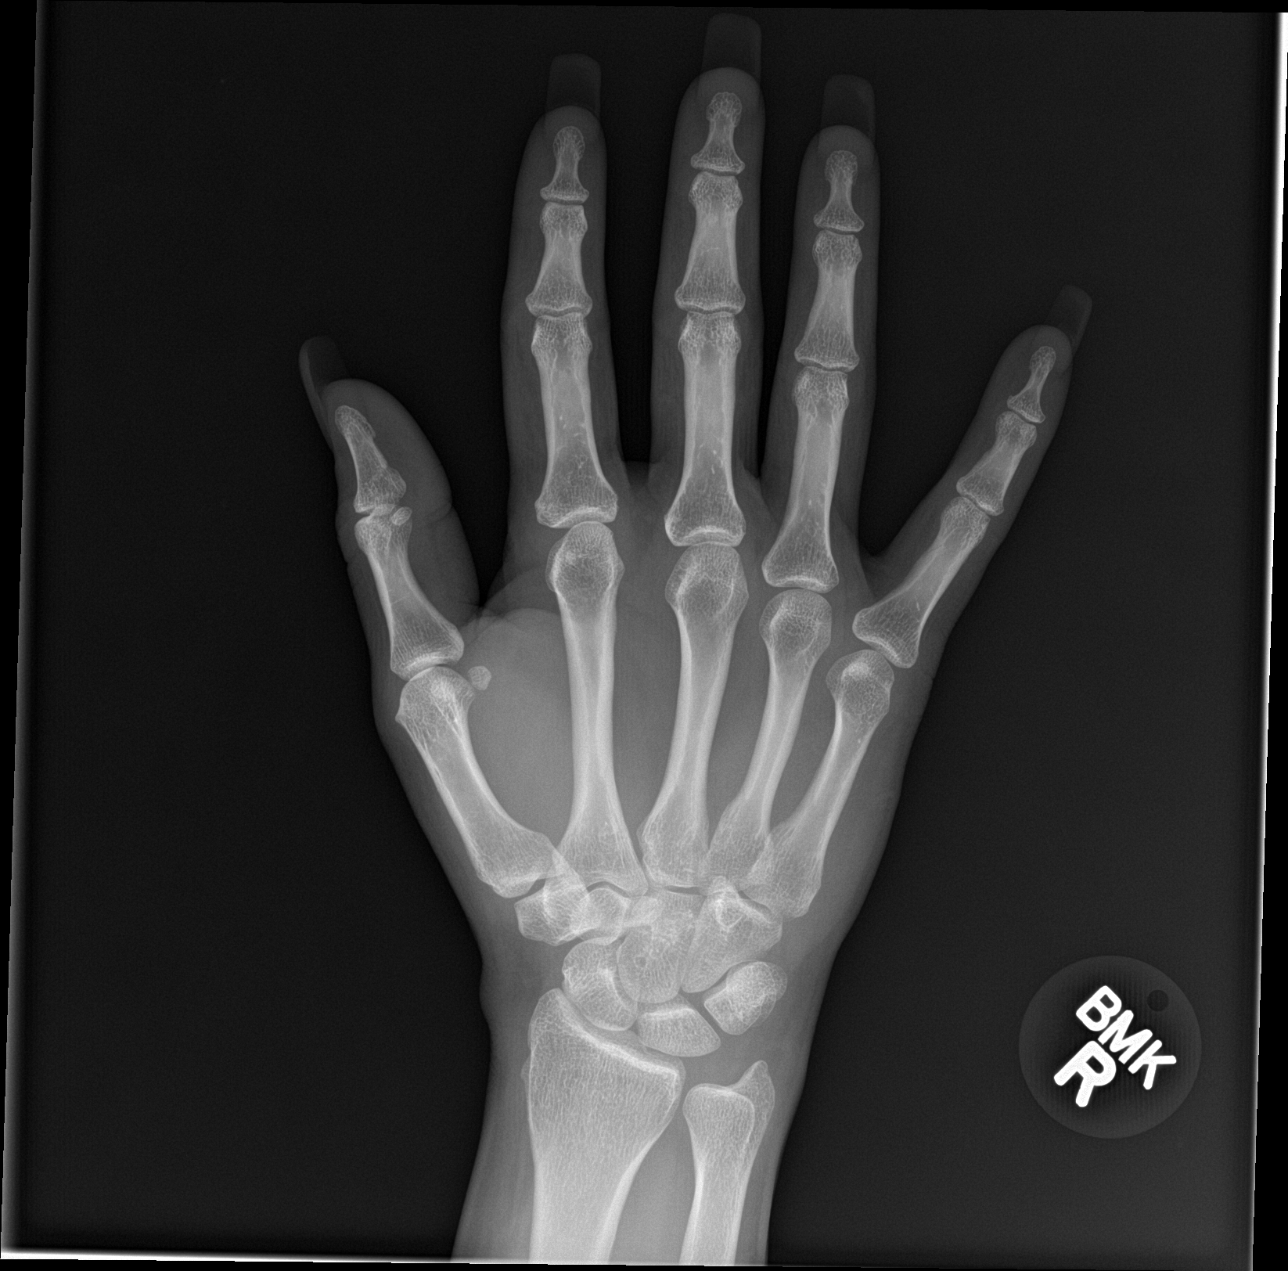

[hand obl]
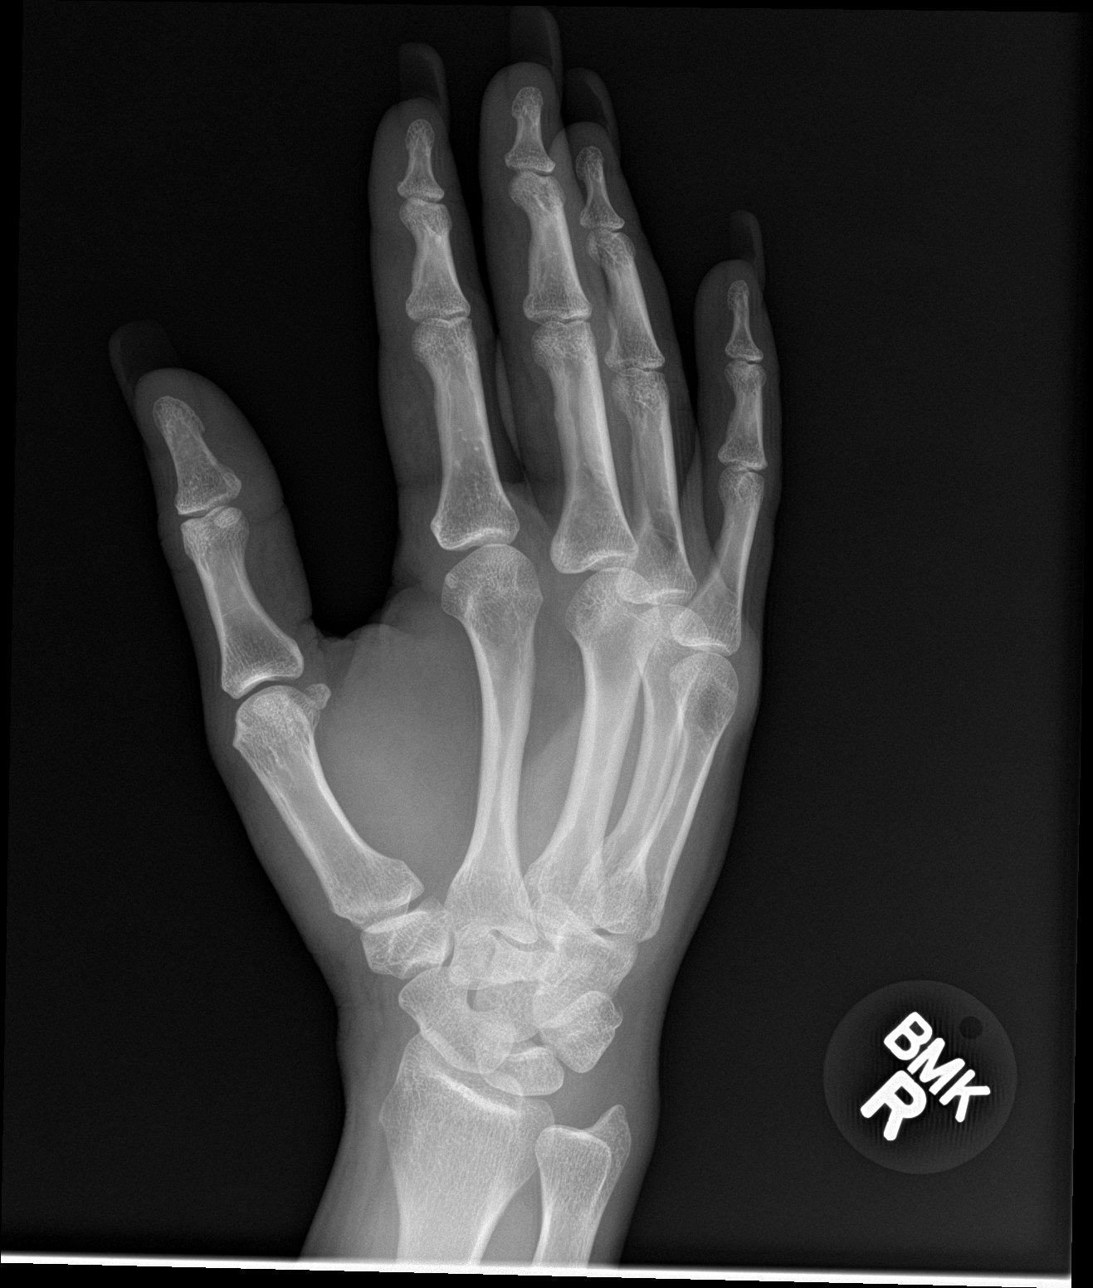

[hand lat]
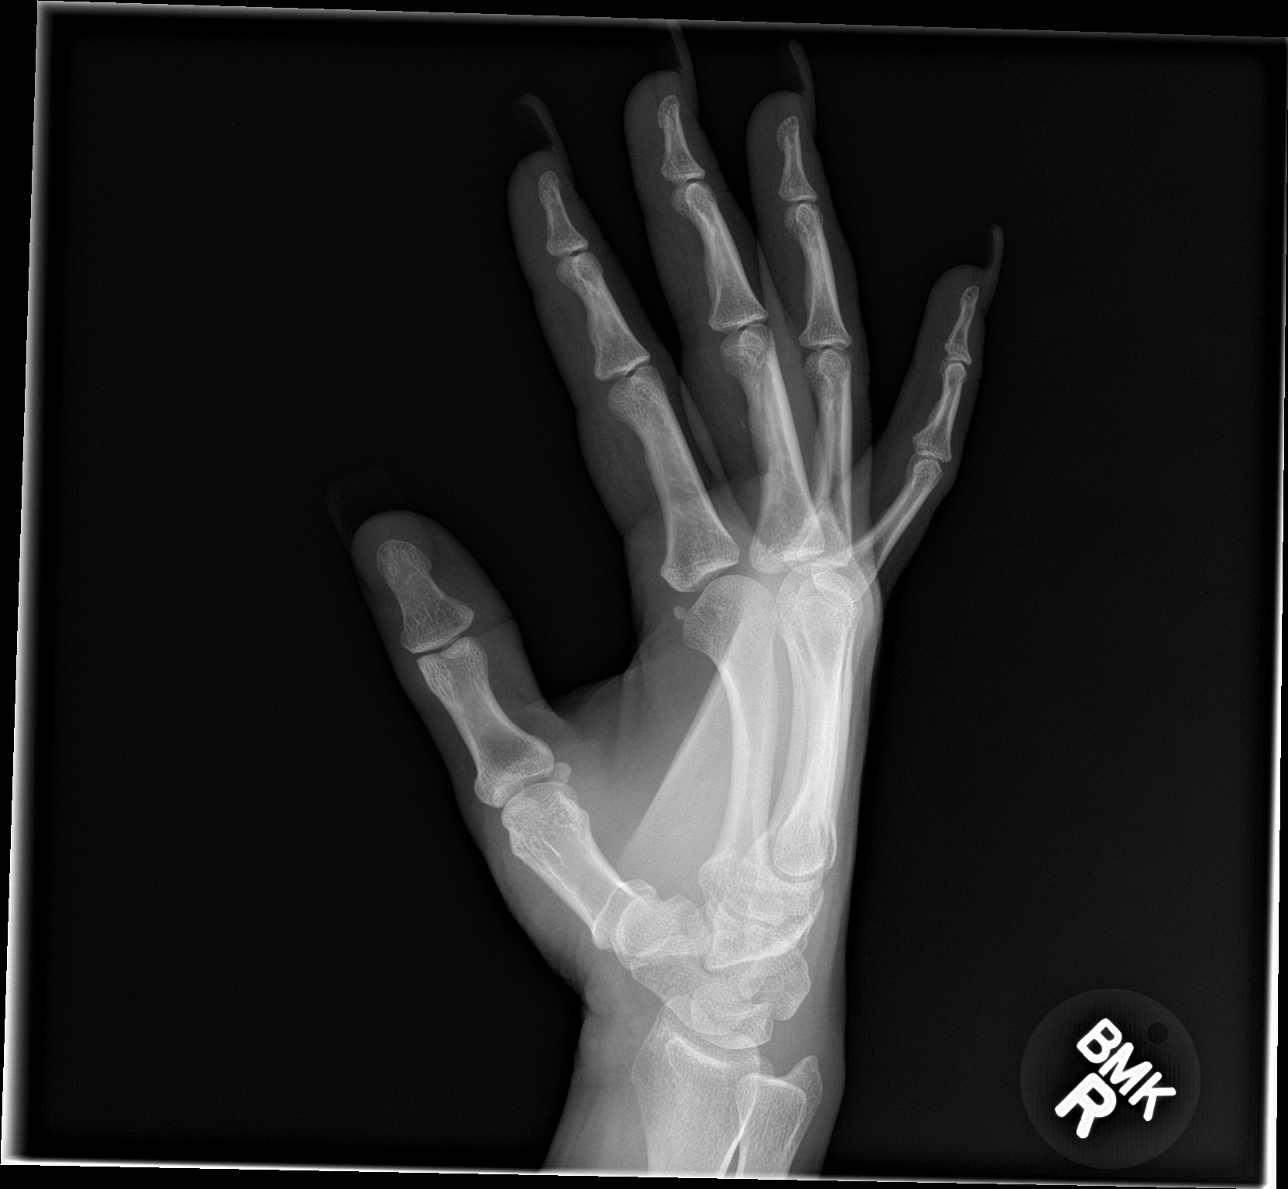

[3 of 3 positions shown; findings below may reference images not displayed]

FINDINGS: There is no evidence of fracture or dislocation. There is no
evidence of arthropathy or other focal bone abnormality. Soft
tissues are unremarkable.
IMPRESSION: Negative.

## 2022-09-09 ENCOUNTER — Ambulatory Visit: Admit: 2022-09-09 | Discharge status: Against Medical Advice | Payer: BLUE CROSS/BLUE SHIELD
